# Patient Record
Sex: Female | Born: 1945 | Race: Black or African American | Hispanic: No | Marital: Married | Smoking: Never smoker
Health system: Southern US, Community
[De-identification: ages and names within clinical notes are randomized; demographics above are authoritative.]

## PROBLEM LIST (undated history)

## (undated) DIAGNOSIS — B029 Zoster without complications: Secondary | ICD-10-CM

## (undated) DIAGNOSIS — E7800 Pure hypercholesterolemia, unspecified: Secondary | ICD-10-CM

## (undated) DIAGNOSIS — E119 Type 2 diabetes mellitus without complications: Secondary | ICD-10-CM

## (undated) DIAGNOSIS — I10 Essential (primary) hypertension: Secondary | ICD-10-CM

---

## 1976-09-09 HISTORY — PX: GUM SURGERY: SHX658

## 2009-12-26 ENCOUNTER — Ambulatory Visit: Payer: Self-pay | Admitting: Cardiology

## 2009-12-26 ENCOUNTER — Emergency Department (HOSPITAL_COMMUNITY): Admission: EM | Admit: 2009-12-26 | Discharge: 2009-12-27 | Payer: Self-pay | Admitting: Emergency Medicine

## 2009-12-27 ENCOUNTER — Encounter (INDEPENDENT_AMBULATORY_CARE_PROVIDER_SITE_OTHER): Payer: Self-pay | Admitting: Emergency Medicine

## 2009-12-27 ENCOUNTER — Ambulatory Visit: Payer: Self-pay | Admitting: Vascular Surgery

## 2010-01-18 ENCOUNTER — Emergency Department (HOSPITAL_COMMUNITY): Admission: EM | Admit: 2010-01-18 | Discharge: 2010-01-18 | Payer: Self-pay | Admitting: Emergency Medicine

## 2010-11-27 LAB — CBC
Hemoglobin: 13.6 g/dL (ref 12.0–15.0)
MCV: 87.9 fL (ref 78.0–100.0)
Platelets: 265 10*3/uL (ref 150–400)
RDW: 13.9 % (ref 11.5–15.5)

## 2010-11-27 LAB — CK TOTAL AND CKMB (NOT AT ARMC)
CK, MB: 1.5 ng/mL (ref 0.3–4.0)
Total CK: 97 U/L (ref 7–177)

## 2010-11-27 LAB — URINE MICROSCOPIC-ADD ON

## 2010-11-27 LAB — LIPID PANEL
Cholesterol: 239 mg/dL — ABNORMAL HIGH (ref 0–200)
HDL: 55 mg/dL (ref >39)
Total CHOL/HDL Ratio: 4.3 RATIO
Triglycerides: 97 mg/dL (ref <150)
VLDL: 19 mg/dL (ref 0–40)

## 2010-11-27 LAB — URINALYSIS, ROUTINE W REFLEX MICROSCOPIC
Bilirubin Urine: NEGATIVE
Hgb urine dipstick: NEGATIVE
Nitrite: NEGATIVE
Specific Gravity, Urine: 1.021 (ref 1.005–1.030)
Urobilinogen, UA: 0.2 mg/dL (ref 0.0–1.0)

## 2010-11-27 LAB — DIFFERENTIAL
Basophils Absolute: 0 10*3/uL (ref 0.0–0.1)
Eosinophils Relative: 2 % (ref 0–5)
Lymphocytes Relative: 26 % (ref 12–46)
Monocytes Relative: 8 % (ref 3–12)
Neutro Abs: 4.9 10*3/uL (ref 1.7–7.7)

## 2010-11-27 LAB — POCT CARDIAC MARKERS
CKMB, poc: 2 ng/mL (ref 1.0–8.0)
Myoglobin, poc: 52.7 ng/mL (ref 12–200)
Troponin i, poc: <0.05 ng/mL (ref 0.00–0.09)

## 2010-11-27 LAB — HEPATIC FUNCTION PANEL
AST: 20 U/L (ref 0–37)
Bilirubin, Direct: <0.1 mg/dL (ref 0.0–0.3)

## 2010-11-27 LAB — BASIC METABOLIC PANEL
BUN: 19 mg/dL (ref 6–23)
CO2: 29 mEq/L (ref 19–32)
Calcium: 9.5 mg/dL (ref 8.4–10.5)
Chloride: 103 mEq/L (ref 96–112)
GFR calc Af Amer: >60 mL/min (ref >60)
Glucose, Bld: 128 mg/dL — ABNORMAL HIGH (ref 70–99)
Sodium: 139 mEq/L (ref 135–145)

## 2010-11-27 LAB — PROTIME-INR: Prothrombin Time: 13.8 seconds (ref 11.6–15.2)

## 2010-11-27 LAB — URINE CULTURE

## 2012-08-16 ENCOUNTER — Encounter (HOSPITAL_COMMUNITY): Payer: Self-pay | Admitting: Emergency Medicine

## 2012-08-16 ENCOUNTER — Emergency Department (HOSPITAL_COMMUNITY)
Admission: EM | Admit: 2012-08-16 | Discharge: 2012-08-16 | Disposition: A | Discharge status: Home / Self Care | Payer: Self-pay | Source: Home / Self Care | Attending: Family Medicine | Admitting: Family Medicine

## 2012-08-16 DIAGNOSIS — B029 Zoster without complications: Secondary | ICD-10-CM

## 2012-08-16 HISTORY — DX: Essential (primary) hypertension: I10

## 2012-08-16 MED ORDER — VALACYCLOVIR HCL 1 G PO TABS: 1000.0000 mg | ORAL_TABLET | Freq: Three times a day (TID) | ORAL | Status: DC

## 2012-08-16 NOTE — ED Provider Notes (Signed)
History     CSN: [id number]  Arrival date & time 08/16/12  1144   First MD Initiated Contact with Patient 08/16/12 1152      Chief Complaint  Patient presents with  . Rash    rash on back and some spots on chest  . Shoulder Pain    left shoulder blade pain.denies injury    (Consider location/radiation/quality/duration/timing/severity/associated sxs/prior treatment) Patient is a 66 y.o. female presenting with rash and shoulder pain. The history is provided by the patient.  Rash  This is a new problem. The current episode started more than 2 days ago. The problem has been gradually worsening. The problem is associated with an unknown factor. There has been no fever. The rash is present on the trunk. The pain is moderate. Associated symptoms include blisters and pain.  Shoulder Pain    Past Medical History  Diagnosis Date  . Hypertension     History reviewed. No pertinent past surgical history.  History reviewed. No pertinent family history.  History  Substance Use Topics  . Smoking status: Never Smoker   . Smokeless tobacco: Not on file  . Alcohol Use: No    OB History    Grav Para Term Preterm Abortions TAB SAB Ect Mult Living                  Review of Systems  Constitutional: Negative.   Musculoskeletal: Positive for back pain.  Skin: Positive for rash.    Allergies  Review of patient's allergies indicates no known allergies.  Home Medications   Current Outpatient Rx  Name  Route  Sig  Dispense  Refill  . HYDROCHLOROTHIAZIDE 25 MG PO TABS   Oral   Take 25 mg by mouth daily.         . TRIAMTERENE-HCTZ 37.5-25 MG PO CAPS   Oral   Take 1 capsule by mouth every morning.         Marland Kitchen VALACYCLOVIR HCL 1 G PO TABS   Oral   Take 1 tablet (1,000 mg total) by mouth 3 (three) times daily.   21 tablet   0     BP 186/102  Pulse 74  Temp 98.4 F (36.9 C) (Oral)  Resp 17  SpO2 98%  Physical Exam  Nursing note and vitals reviewed. Constitutional:  She appears well-developed and well-nourished.  Neck: Normal range of motion. Neck supple.  Pulmonary/Chest: Effort normal and breath sounds normal. She exhibits tenderness.  Lymphadenopathy:    She has no cervical adenopathy.  Skin: Skin is warm and dry. Rash noted.       Erythematous based grouped crusting vesicles upper left back extending to upper ant chest.    ED Course  Procedures (including critical care time)  Labs Reviewed - No data to display No results found.   1. Shingles       MDM          Linna Hoff, MD 08/16/12 1218

## 2012-08-16 NOTE — ED Notes (Signed)
Waiting discharge  

## 2012-08-16 NOTE — ED Notes (Signed)
Pt states that she has a rash on her back and some spots on chest. Pt was seen at a urgent care on Wednesday and given an injection that relieved pain and rash .  Pt states that felt better for a couple of days then symptoms returned and is gradually getting worse. Rash is red and blistered

## 2012-08-17 ENCOUNTER — Encounter (HOSPITAL_COMMUNITY): Payer: Self-pay | Admitting: *Deleted

## 2012-08-17 ENCOUNTER — Emergency Department (HOSPITAL_COMMUNITY)
Admission: EM | Admit: 2012-08-17 | Discharge: 2012-08-18 | Disposition: A | Discharge status: Home / Self Care | Payer: Self-pay | Attending: Emergency Medicine | Admitting: Emergency Medicine

## 2012-08-17 DIAGNOSIS — Z8673 Personal history of transient ischemic attack (TIA), and cerebral infarction without residual deficits: Secondary | ICD-10-CM | POA: Insufficient documentation

## 2012-08-17 DIAGNOSIS — Z79899 Other long term (current) drug therapy: Secondary | ICD-10-CM | POA: Insufficient documentation

## 2012-08-17 DIAGNOSIS — I10 Essential (primary) hypertension: Secondary | ICD-10-CM | POA: Insufficient documentation

## 2012-08-17 DIAGNOSIS — R42 Dizziness and giddiness: Secondary | ICD-10-CM | POA: Insufficient documentation

## 2012-08-17 DIAGNOSIS — Z7982 Long term (current) use of aspirin: Secondary | ICD-10-CM | POA: Insufficient documentation

## 2012-08-17 LAB — COMPREHENSIVE METABOLIC PANEL
Albumin: 4 g/dL (ref 3.5–5.2)
BUN: 24 mg/dL — ABNORMAL HIGH (ref 6–23)
CO2: 23 mEq/L (ref 19–32)
Calcium: 9.8 mg/dL (ref 8.4–10.5)
Chloride: 96 mEq/L (ref 96–112)
GFR calc non Af Amer: 87 mL/min — ABNORMAL LOW (ref >90)
Potassium: 4.2 mEq/L (ref 3.5–5.1)
Sodium: 133 mEq/L — ABNORMAL LOW (ref 135–145)

## 2012-08-17 LAB — CBC WITH DIFFERENTIAL/PLATELET
Basophils Absolute: 0 10*3/uL (ref 0.0–0.1)
HCT: 42.6 % (ref 36.0–46.0)
MCH: 28.8 pg (ref 26.0–34.0)
MCHC: 34 g/dL (ref 30.0–36.0)
MCV: 84.5 fL (ref 78.0–100.0)
Monocytes Absolute: 0.5 10*3/uL (ref 0.1–1.0)
Neutrophils Relative %: 60 % (ref 43–77)
Platelets: 319 10*3/uL (ref 150–400)
RBC: 5.04 MIL/uL (ref 3.87–5.11)
RDW: 13.6 % (ref 11.5–15.5)

## 2012-08-17 NOTE — ED Notes (Signed)
CBG 134 

## 2012-08-17 NOTE — ED Notes (Signed)
The pt had a dizzy episode 45 minutes ago that lasted for a short time.  No dizziness now..  She has shingles on her posterior chest that was diagnosed yesterday. No anterior chest pain

## 2012-08-17 NOTE — ED Notes (Signed)
Pt reports taking her valtrex at 7 pm and tylenol at 9 pm.

## 2012-08-17 NOTE — ED Notes (Signed)
Pt ambulated to room without difficulty. Pt reported no dizziness on the way to room

## 2012-08-18 LAB — GLUCOSE, CAPILLARY: Glucose-Capillary: 134 mg/dL — ABNORMAL HIGH (ref 70–99)

## 2012-08-18 NOTE — ED Provider Notes (Addendum)
History     CSN: [id number]  Arrival date & time 08/17/12  2231   First MD Initiated Contact with Patient 08/18/12 0059      Chief Complaint  Patient presents with  . Dizziness    (Consider location/radiation/quality/duration/timing/severity/associated sxs/prior treatment) HPI Comments: Patient states, that when she stood up from a lying position.  She had about 7 minutes of dizziness.  That resolved after she sat back down.  She reports that in April of 2001 she had a TIA.  She's had no problems since then.  She also reports, that she recently started taking Dyazide for high blood pressure, as well as one day ago.  Started taking Valtrex for a new outbreak of shingles.  She denies any chest pain, shortness of breath, nausea, headache, or visual change  The history is provided by the patient.    Past Medical History  Diagnosis Date  . Hypertension     History reviewed. No pertinent past surgical history.  No family history on file.  History  Substance Use Topics  . Smoking status: Never Smoker   . Smokeless tobacco: Not on file  . Alcohol Use: No    OB History    Grav Para Term Preterm Abortions TAB SAB Ect Mult Living                  Review of Systems  Constitutional: Negative for fever.  Respiratory: Negative for shortness of breath.   Cardiovascular: Negative for chest pain.  Gastrointestinal: Negative for nausea.  Skin: Positive for rash. Negative for wound.  Neurological: Positive for dizziness. Negative for weakness and headaches.    Allergies  Review of patient's allergies indicates no known allergies.  Home Medications   Current Outpatient Rx  Name  Route  Sig  Dispense  Refill  . ACETAMINOPHEN 325 MG PO TABS   Oral   Take 650 mg by mouth every 6 (six) hours as needed. For pain         . ASPIRIN 81 MG PO CHEW   Oral   Chew 81 mg by mouth daily.         . TRIAMTERENE-HCTZ 37.5-25 MG PO CAPS   Oral   Take 1 capsule by mouth every  morning.         Marland Kitchen VALACYCLOVIR HCL 1 G PO TABS   Oral   Take 1 tablet (1,000 mg total) by mouth 3 (three) times daily.   21 tablet   0     BP 153/94  Pulse 89  Temp 98 F (36.7 C) (Oral)  Resp 20  SpO2 98%  Physical Exam  Constitutional: She is oriented to person, place, and time. She appears well-developed and well-nourished.  HENT:  Head: Normocephalic.  Left Ear: External ear normal.  Eyes: Pupils are equal, round, and reactive to light.  Cardiovascular: Normal rate.   Pulmonary/Chest: Effort normal.  Abdominal: She exhibits no distension. There is no tenderness.  Musculoskeletal: She exhibits no edema and no tenderness.  Neurological: She is alert and oriented to person, place, and time.  Skin: Rash noted.       Shingles rash back to front along thoracic nerve pathway     ED Course  Procedures (including critical care time)  Labs Reviewed  COMPREHENSIVE METABOLIC PANEL - Abnormal; Notable for the following:    Sodium 133 (*)     Glucose, Bld 203 (*)     BUN 24 (*)     Total Protein 8.8 (*)  Total Bilirubin 0.2 (*)     GFR calc non Af Amer 87 (*)     All other components within normal limits  CBC WITH DIFFERENTIAL  TROPONIN I  TROPONIN I   No results found.   1. Dizzy     ED ECG REPORT   Date: 08/18/2012  EKG Time: 2:39 AM  Rate: 93  Rhythm: normal sinus rhythm,  there are no previous tracings available for comparison  Axis: normal  Intervals:none  ST&T Change: biatrial enlargement  Narrative Interpretation: abnormal            MDM   Patient had no further episodes of "dizziness" I feel that this was a situational dizziness.  When she got up from lying down and starting a new blood pressure medication, and instructed her to change positions slowly        Arman Filter, NP 08/18/12 0239  Arman Filter, NP 08/26/12 [phone]

## 2012-08-18 NOTE — ED Provider Notes (Signed)
Medical screening examination/treatment/procedure(s) were performed by non-physician practitioner and as supervising physician I was immediately available for consultation/collaboration.   Azarie Coriz L Baylyn Sickles, MD 08/18/12 0734 

## 2012-12-19 ENCOUNTER — Emergency Department (HOSPITAL_BASED_OUTPATIENT_CLINIC_OR_DEPARTMENT_OTHER): Payer: Medicare Other

## 2012-12-19 ENCOUNTER — Encounter (HOSPITAL_BASED_OUTPATIENT_CLINIC_OR_DEPARTMENT_OTHER): Payer: Self-pay

## 2012-12-19 ENCOUNTER — Emergency Department (HOSPITAL_BASED_OUTPATIENT_CLINIC_OR_DEPARTMENT_OTHER)
Admission: EM | Admit: 2012-12-19 | Discharge: 2012-12-19 | Disposition: A | Discharge status: Home / Self Care | Payer: Medicare Other | Attending: Emergency Medicine | Admitting: Emergency Medicine

## 2012-12-19 DIAGNOSIS — Y92838 Other recreation area as the place of occurrence of the external cause: Secondary | ICD-10-CM | POA: Insufficient documentation

## 2012-12-19 DIAGNOSIS — I10 Essential (primary) hypertension: Secondary | ICD-10-CM | POA: Insufficient documentation

## 2012-12-19 DIAGNOSIS — Y9239 Other specified sports and athletic area as the place of occurrence of the external cause: Secondary | ICD-10-CM | POA: Insufficient documentation

## 2012-12-19 DIAGNOSIS — Y9301 Activity, walking, marching and hiking: Secondary | ICD-10-CM | POA: Insufficient documentation

## 2012-12-19 DIAGNOSIS — Z7982 Long term (current) use of aspirin: Secondary | ICD-10-CM | POA: Insufficient documentation

## 2012-12-19 DIAGNOSIS — S0083XA Contusion of other part of head, initial encounter: Secondary | ICD-10-CM

## 2012-12-19 DIAGNOSIS — Z23 Encounter for immunization: Secondary | ICD-10-CM | POA: Insufficient documentation

## 2012-12-19 DIAGNOSIS — S0003XA Contusion of scalp, initial encounter: Secondary | ICD-10-CM | POA: Insufficient documentation

## 2012-12-19 DIAGNOSIS — W1809XA Striking against other object with subsequent fall, initial encounter: Secondary | ICD-10-CM | POA: Insufficient documentation

## 2012-12-19 DIAGNOSIS — W010XXA Fall on same level from slipping, tripping and stumbling without subsequent striking against object, initial encounter: Secondary | ICD-10-CM | POA: Insufficient documentation

## 2012-12-19 MED ORDER — TETANUS-DIPHTH-ACELL PERTUSSIS 5-2.5-18.5 LF-MCG/0.5 IM SUSP
0.5000 mL | Freq: Once | INTRAMUSCULAR | Status: AC
  Administered 2012-12-19: 0.5 mL via INTRAMUSCULAR
  Filled 2012-12-19: qty 0.5

## 2012-12-19 NOTE — ED Provider Notes (Signed)
History     CSN: [id number]  Arrival date & time 12/19/12  4098   First MD Initiated Contact with Patient 12/19/12 2152      Chief Complaint  Patient presents with  . fall-laceration to forehead     (Consider location/radiation/quality/duration/timing/severity/associated sxs/prior treatment) Patient is a 67 y.o. female presenting with fall. The history is provided by the patient. No language interpreter was used.  Fall The accident occurred 6 to 12 hours ago. The fall occurred while walking. She fell from a height of 3 to 5 ft. Impact surface: exercise block. There was no blood loss. The point of impact was the head. The pain is present in the head. The pain is moderate. She was ambulatory at the scene. Pertinent negatives include no visual change. She has tried nothing for the symptoms.   Pt reports she tripped and fell and hit head.   Pt complains of large area of swelling over right forehead Past Medical History  Diagnosis Date  . Hypertension     History reviewed. No pertinent past surgical history.  No family history on file.  History  Substance Use Topics  . Smoking status: Never Smoker   . Smokeless tobacco: Not on file  . Alcohol Use: No    OB History   Grav Para Term Preterm Abortions TAB SAB Ect Mult Living                  Review of Systems  HENT: Positive for facial swelling.   All other systems reviewed and are negative.    Allergies  Review of patient's allergies indicates no known allergies.  Home Medications   Current Outpatient Rx  Name  Route  Sig  Dispense  Refill  . acetaminophen (TYLENOL) 325 MG tablet   Oral   Take 650 mg by mouth every 6 (six) hours as needed. For pain         . aspirin 81 MG chewable tablet   Oral   Chew 81 mg by mouth daily.           BP 190/78  Pulse 79  Temp(Src) 98.4 F (36.9 C) (Oral)  Resp 18  Ht 5\' 2"  (1.575 m)  Wt 170 lb (77.111 kg)  BMI 31.09 kg/m2  SpO2 100%  Physical Exam  Nursing note  and vitals reviewed. Constitutional: She is oriented to person, place, and time. She appears well-developed and well-nourished.  HENT:  Right Ear: External ear normal.  Left Ear: External ear normal.  Nose: Nose normal.  Mouth/Throat: Oropharynx is clear and moist.  Eyes: Conjunctivae and EOM are normal. Pupils are equal, round, and reactive to light.  Neck: Normal range of motion.  Cardiovascular: Normal rate and normal heart sounds.   Pulmonary/Chest: Effort normal and breath sounds normal.  Abdominal: Soft.  Musculoskeletal:  Left elbow, nontender,  Right knee nontender  Neurological: She is alert and oriented to person, place, and time. She has normal reflexes.  Swollen right forehead, bruised,    Skin: Skin is warm.  Psychiatric: She has a normal mood and affect.    ED Course  Procedures (including critical care time)  Labs Reviewed - No data to display Ct Head Wo Contrast  12/19/2012  *RADIOLOGY REPORT*  Clinical Data: 67 year old female status post fall.  Swelling. Hypertension.  CT HEAD WITHOUT CONTRAST  Technique:  Contiguous axial images were obtained from the base of the skull through the vertex without contrast.  Comparison: Brain MRI and head CT 12/27/2009.  Findings: Visualized paranasal sinuses and mastoids are clear. Right forehead scalp hematoma measures up to 7 mm in thickness. Underlying right frontal bone intact. Visualized orbit soft tissues are within normal limits.  Osteopenia. No acute osseous abnormality identified.  No other scalp soft tissue injury.  Calcified atherosclerosis at the skull base.  Stable and normal cerebral volume.  No ventriculomegaly. No midline shift, mass effect, or evidence of mass lesion.  No acute intracranial hemorrhage identified.  Chronic left basal ganglia lacunar infarcts with subsequent hypodensity, not significantly changed.  Stable and normal for age gray-white matter differentiation elsewhere. No suspicious intracranial vascular  hyperdensity.  IMPRESSION: 1.  Scalp soft tissue injury without underlying fracture. 2.  Stable chronic small vessel disease. No acute intracranial abnormality.   Original Report Authenticated By: Erskine Speed, M.D.      No diagnosis found.    MDM  I advised ice and ibuprofen        Lonia Skinner Waka, PA-C 12/19/12 2300

## 2012-12-19 NOTE — ED Notes (Signed)
Patient reports that she fell over stool at gym at 1pm today and has abrasions to right side of forehead, minimal bleeding on arrival, no loc.

## 2012-12-20 NOTE — ED Provider Notes (Signed)
Medical screening examination/treatment/procedure(s) were performed by non-physician practitioner and as supervising physician I was immediately available for consultation/collaboration.   Ajene Carchi L Leilanni Halvorson, MD 12/20/12 0657 

## 2012-12-22 ENCOUNTER — Encounter (HOSPITAL_BASED_OUTPATIENT_CLINIC_OR_DEPARTMENT_OTHER): Payer: Self-pay | Admitting: Emergency Medicine

## 2012-12-22 ENCOUNTER — Emergency Department (HOSPITAL_BASED_OUTPATIENT_CLINIC_OR_DEPARTMENT_OTHER): Payer: Medicare Other

## 2012-12-22 ENCOUNTER — Emergency Department (HOSPITAL_BASED_OUTPATIENT_CLINIC_OR_DEPARTMENT_OTHER)
Admission: EM | Admit: 2012-12-22 | Discharge: 2012-12-22 | Disposition: A | Discharge status: Home / Self Care | Payer: Medicare Other | Attending: Emergency Medicine | Admitting: Emergency Medicine

## 2012-12-22 DIAGNOSIS — S1093XA Contusion of unspecified part of neck, initial encounter: Secondary | ICD-10-CM | POA: Insufficient documentation

## 2012-12-22 DIAGNOSIS — Z7982 Long term (current) use of aspirin: Secondary | ICD-10-CM | POA: Insufficient documentation

## 2012-12-22 DIAGNOSIS — Y939 Activity, unspecified: Secondary | ICD-10-CM | POA: Insufficient documentation

## 2012-12-22 DIAGNOSIS — W1809XA Striking against other object with subsequent fall, initial encounter: Secondary | ICD-10-CM | POA: Insufficient documentation

## 2012-12-22 DIAGNOSIS — Y929 Unspecified place or not applicable: Secondary | ICD-10-CM | POA: Insufficient documentation

## 2012-12-22 DIAGNOSIS — I10 Essential (primary) hypertension: Secondary | ICD-10-CM | POA: Insufficient documentation

## 2012-12-22 DIAGNOSIS — R42 Dizziness and giddiness: Secondary | ICD-10-CM | POA: Insufficient documentation

## 2012-12-22 DIAGNOSIS — H5789 Other specified disorders of eye and adnexa: Secondary | ICD-10-CM | POA: Insufficient documentation

## 2012-12-22 DIAGNOSIS — S0990XA Unspecified injury of head, initial encounter: Secondary | ICD-10-CM

## 2012-12-22 DIAGNOSIS — S0083XD Contusion of other part of head, subsequent encounter: Secondary | ICD-10-CM

## 2012-12-22 DIAGNOSIS — S0003XA Contusion of scalp, initial encounter: Secondary | ICD-10-CM | POA: Insufficient documentation

## 2012-12-22 NOTE — ED Notes (Signed)
Patient transported to CT 

## 2012-12-22 NOTE — ED Notes (Signed)
Pt fell on Saturday, hit her head.  No loc.  Pt woke up this am with headache and dizziness.  No N/V or confusion.

## 2012-12-22 NOTE — ED Provider Notes (Signed)
History     CSN: [id number]  Arrival date & time 12/22/12  1104   First MD Initiated Contact with Patient 12/22/12 1132      Chief Complaint  Patient presents with  . Headache  . Facial Swelling    (Consider location/radiation/quality/duration/timing/severity/associated sxs/prior treatment) HPI Comments: Patient present with facial swelling, headache the onset yesterday. She was seen on April 12 after a fall where she hit her head and scraped her forehead. CT scan was negative. She states she felt well for one day but then headache and dizziness returned. She noticed swelling and bruising below her eyes bilaterally. Denies any visual changes or pain with eye movement. Denies any nausea, vomiting, confusion or fever. Denies taking any anticoagulants except aspirin. Denies any new falls.  The history is provided by the patient.    Past Medical History  Diagnosis Date  . Hypertension     No past surgical history on file.  No family history on file.  History  Substance Use Topics  . Smoking status: Never Smoker   . Smokeless tobacco: Not on file  . Alcohol Use: No    OB History   Grav Para Term Preterm Abortions TAB SAB Ect Mult Living                  Review of Systems  Constitutional: Negative for activity change and appetite change.  HENT: Negative for neck pain.   Eyes: Positive for redness. Negative for visual disturbance.  Respiratory: Negative for chest tightness.   Gastrointestinal: Negative for nausea, vomiting and abdominal pain.  Neurological: Positive for dizziness and headaches. Negative for weakness.  A complete 10 system review of systems was obtained and all systems are negative except as noted in the HPI and PMH.    Allergies  Review of patient's allergies indicates no known allergies.  Home Medications   Current Outpatient Rx  Name  Route  Sig  Dispense  Refill  . naproxen sodium (ANAPROX) 220 MG tablet   Oral   Take 220 mg by mouth 2 (two)  times daily with a meal.         . acetaminophen (TYLENOL) 325 MG tablet   Oral   Take 650 mg by mouth every 6 (six) hours as needed. For pain         . aspirin 81 MG chewable tablet   Oral   Chew 81 mg by mouth daily.           BP 185/91  Pulse 68  Temp(Src) 98.9 F (37.2 C) (Oral)  Resp 20  Ht 5\' 2"  (1.575 m)  Wt 165 lb (74.844 kg)  BMI 30.17 kg/m2  SpO2 100%  Physical Exam  Constitutional: She is oriented to person, place, and time. She appears well-developed and well-nourished. No distress.  HENT:  Head: Normocephalic.  Mouth/Throat: Oropharynx is clear and moist. No oropharyngeal exudate.  Right forehead hematoma and abrasion  Eyes: Conjunctivae and EOM are normal. Pupils are equal, round, and reactive to light.  Periorbital ecchymosis of lower lids bilaterally, raccoon eyes  Neck: Normal range of motion. Neck supple.  No C-spine pain, step-off or deformity  Cardiovascular: Normal rate, regular rhythm and normal heart sounds.   No murmur heard. Pulmonary/Chest: Effort normal and breath sounds normal. No respiratory distress.  Abdominal: Soft. There is no tenderness. There is no rebound and no guarding.  Musculoskeletal: Normal range of motion. She exhibits no edema and no tenderness.  Neurological: She is alert and  oriented to person, place, and time. No cranial nerve deficit. She exhibits normal muscle tone. Coordination normal.  CN 2-12 intact, 5/5 strength throughout, no ataxia on finger to nose  Skin: Skin is warm.    ED Course  Procedures (including critical care time)  Labs Reviewed - No data to display Ct Head Wo Contrast  12/22/2012  *RADIOLOGY REPORT*  Clinical Data:  Fall 12/19/2012 with head injury.  Progressive headache and dizziness  CT HEAD WITHOUT CONTRAST CT MAXILLOFACIAL WITHOUT CONTRAST  Technique:  Multidetector CT imaging of the head and maxillofacial structures were performed using the standard protocol without intravenous contrast.  Multiplanar CT image reconstructions of the maxillofacial structures were also generated.  Comparison:  CT head 12/19/2012  CT HEAD  Findings: Small right frontal scalp hematoma.  Negative for skull fracture.  Negative for intracranial hemorrhage.  Negative for mass or infarct.  Ventricle size is normal.  IMPRESSION: No acute intracranial abnormality.  CT MAXILLOFACIAL  Findings:   Negative for facial fracture.  No fracture of the mandible or orbit.  Zygomatic arch is intact bilaterally.  Nasal bone is intact.  Extensive dental disease is present with periapical lucency lucencies around multiple teeth.  IMPRESSION: Negative for fracture.   Original Report Authenticated By: Janeece Riggers, M.D.    Ct Maxillofacial Wo Cm  12/22/2012  *RADIOLOGY REPORT*  Clinical Data:  Fall 12/19/2012 with head injury.  Progressive headache and dizziness  CT HEAD WITHOUT CONTRAST CT MAXILLOFACIAL WITHOUT CONTRAST  Technique:  Multidetector CT imaging of the head and maxillofacial structures were performed using the standard protocol without intravenous contrast. Multiplanar CT image reconstructions of the maxillofacial structures were also generated.  Comparison:  CT head 12/19/2012  CT HEAD  Findings: Small right frontal scalp hematoma.  Negative for skull fracture.  Negative for intracranial hemorrhage.  Negative for mass or infarct.  Ventricle size is normal.  IMPRESSION: No acute intracranial abnormality.  CT MAXILLOFACIAL  Findings:   Negative for facial fracture.  No fracture of the mandible or orbit.  Zygomatic arch is intact bilaterally.  Nasal bone is intact.  Extensive dental disease is present with periapical lucency lucencies around multiple teeth.  IMPRESSION: Negative for fracture.   Original Report Authenticated By: Janeece Riggers, M.D.      1. Facial contusion, subsequent encounter   2. Head injury, initial encounter       MDM  Facial swelling and bruising after fall 3 days ago. Nonfocal neuro exam.  CT from  3 days ago did not show any acute pathology. Patient with extensive ecchymosis below eyes today that concerns her.  CT negative for any facial bone fractures.  Patient instructed on time course of concussion and head injury. She'll return with worsening headache, confusion, vomiting, vision changes or any other concerns.    Glynn Octave, MD 12/22/12 [phone]

## 2013-02-11 ENCOUNTER — Encounter (HOSPITAL_COMMUNITY): Payer: Self-pay | Admitting: Emergency Medicine

## 2013-02-11 ENCOUNTER — Emergency Department (INDEPENDENT_AMBULATORY_CARE_PROVIDER_SITE_OTHER)
Admission: EM | Admit: 2013-02-11 | Discharge: 2013-02-11 | Disposition: A | Discharge status: Home / Self Care | Payer: Medicare Other | Source: Home / Self Care | Attending: Emergency Medicine | Admitting: Emergency Medicine

## 2013-02-11 ENCOUNTER — Emergency Department (INDEPENDENT_AMBULATORY_CARE_PROVIDER_SITE_OTHER): Payer: Medicare Other

## 2013-02-11 DIAGNOSIS — M546 Pain in thoracic spine: Secondary | ICD-10-CM

## 2013-02-11 MED ORDER — CYCLOBENZAPRINE HCL 10 MG PO TABS: 10.0000 mg | ORAL_TABLET | Freq: Two times a day (BID) | ORAL | Status: DC | PRN

## 2013-02-11 MED ORDER — MELOXICAM 7.5 MG PO TABS: 7.5000 mg | ORAL_TABLET | Freq: Every day | ORAL | Status: AC

## 2013-02-11 MED ORDER — MELOXICAM 7.5 MG PO TABS: 7.5000 mg | ORAL_TABLET | Freq: Every day | ORAL | Status: DC

## 2013-02-11 NOTE — ED Notes (Signed)
Patient reported she needed to leave to get child.  Stated she could return, but would have to leave.

## 2013-02-11 NOTE — ED Provider Notes (Signed)
History     CSN: [id number]  Arrival date & time 02/11/13  1214   First MD Initiated Contact with Patient 02/11/13 1332      Chief Complaint  Patient presents with  . Back Pain    (Consider location/radiation/quality/duration/timing/severity/associated sxs/prior treatment) HPI Comments: Patient presents to urgent care describing pain on the left side of her back that started Tuesday when she was working. She was just in a sitting position when this discomfort started. She describes the pain somewhat exacerbates when she takes a deep breath, most movements in palpating the area doesn't seem to exacerbate the pain. Patient denies any other symptoms such as radiation of this pain, associated chest pain, shortness of breath or cough. She denies any recent injuries or falls.  Patient is a 67 y.o. female presenting with back pain.  Back Pain Location:  Thoracic spine Quality:  Aching Radiates to:  Does not radiate Pain severity:  Moderate Onset quality:  Sudden Timing:  Constant Chronicity:  New Context: not emotional stress, not physical stress and not recent illness   Relieved by:  Nothing Worsened by:  Movement, deep breathing and coughing Associated symptoms: no abdominal pain, no abdominal swelling, no bladder incontinence, no bowel incontinence, no dysuria, no fever, no headaches, no numbness, no paresthesias, no perianal numbness, no tingling, no weakness and no weight loss     Past Medical History  Diagnosis Date  . Hypertension     History reviewed. No pertinent past surgical history.  History reviewed. No pertinent family history.  History  Substance Use Topics  . Smoking status: Never Smoker   . Smokeless tobacco: Not on file  . Alcohol Use: No    OB History   Grav Para Term Preterm Abortions TAB SAB Ect Mult Living                  Review of Systems  Constitutional: Positive for activity change. Negative for fever, chills, weight loss, diaphoresis, appetite  change and fatigue.  Respiratory: Negative for apnea, choking, chest tightness and shortness of breath.   Gastrointestinal: Negative for abdominal pain and bowel incontinence.  Genitourinary: Negative for bladder incontinence and dysuria.  Musculoskeletal: Positive for back pain. Negative for myalgias, joint swelling and arthralgias.  Skin: Negative for color change, rash and wound.  Neurological: Negative for tingling, weakness, numbness, headaches and paresthesias.    Allergies  Review of patient's allergies indicates no known allergies.  Home Medications   Current Outpatient Rx  Name  Route  Sig  Dispense  Refill  . acetaminophen (TYLENOL) 325 MG tablet   Oral   Take 650 mg by mouth every 6 (six) hours as needed. For pain         . aspirin 81 MG chewable tablet   Oral   Chew 81 mg by mouth daily.         . cyclobenzaprine (FLEXERIL) 10 MG tablet   Oral   Take 1 tablet (10 mg total) by mouth 2 (two) times daily as needed for muscle spasms.   20 tablet   0   . meloxicam (MOBIC) 7.5 MG tablet   Oral   Take 1 tablet (7.5 mg total) by mouth daily. Take one tablet daily for 2 weeks   14 tablet   0     BP 159/75  Pulse 70  Temp(Src) 98 F (36.7 C) (Oral)  Resp 16  SpO2 100%  Physical Exam  Nursing note and vitals reviewed. Constitutional: She appears well-developed  and well-nourished.  Pulmonary/Chest: Effort normal and breath sounds normal. No respiratory distress. She has no wheezes. She has no rales. She exhibits no tenderness.  Abdominal: Soft.  Musculoskeletal: She exhibits tenderness. She exhibits no edema.       Back:  Neurological: She is alert.  Skin: No rash noted. No erythema.    ED Course  Procedures (including critical care time)  Labs Reviewed - No data to display Dg Chest 2 View  02/11/2013   *RADIOLOGY REPORT*  Clinical Data: 68 year old female with back pain.  CHEST - 2 VIEW  Comparison: 12/27/2009.  Findings: Chronic cardiomegaly is  stable.  Mild tortuosity of the thoracic aorta. Other mediastinal contours are within normal limits.  Visualized tracheal air column is within normal limits. Stable widespread increased pulmonary interstitial markings.  No pneumothorax, pulmonary edema, pleural effusion or confluent pulmonary opacity. No acute osseous abnormality identified.  IMPRESSION: Stable mild cardiomegaly.  No acute cardiopulmonary abnormality.   Original Report Authenticated By: Erskine Speed, M.D.     1. Thoracic back pain       MDM  Patient left urgent care prior to discharge. Have called Mrs. Howson, informing her of her x-ray results and my diagnostic impression we discussed the 2 prescriptions that I want her to try and we also discussed circumstances that will require further evaluation and followup. She agrees with treatment plan and followup care as necessary. Have discussed with her that her discharge papers and prescriptions will be in the reception area-        Jimmie Molly, MD 02/11/13 1439

## 2013-02-11 NOTE — ED Notes (Signed)
Patient has pain on left side of back, along bra line.  Patient reports this sensation reminds here of when she had shingles in December.  Onset of this episode Tuesday 02/09/13.  Patient noticed pain throughout the day, worsening.  No known injury

## 2013-02-11 NOTE — ED Notes (Signed)
Instructed to place on gown 

## 2014-03-27 ENCOUNTER — Encounter (HOSPITAL_COMMUNITY): Payer: Self-pay | Admitting: Emergency Medicine

## 2014-03-27 ENCOUNTER — Emergency Department (INDEPENDENT_AMBULATORY_CARE_PROVIDER_SITE_OTHER)
Admission: EM | Admit: 2014-03-27 | Discharge: 2014-03-27 | Disposition: A | Discharge status: Home / Self Care | Payer: Medicare Other | Source: Home / Self Care | Attending: Emergency Medicine | Admitting: Emergency Medicine

## 2014-03-27 DIAGNOSIS — R05 Cough: Secondary | ICD-10-CM

## 2014-03-27 DIAGNOSIS — R059 Cough, unspecified: Secondary | ICD-10-CM

## 2014-03-27 HISTORY — DX: Pure hypercholesterolemia, unspecified: E78.00

## 2014-03-27 HISTORY — DX: Type 2 diabetes mellitus without complications: E11.9

## 2014-03-27 NOTE — ED Provider Notes (Signed)
CSN: [id number]     [id number] date & time 03/27/14  1731 History   First MD Initiated Contact with Patient 03/27/14 1822     Chief Complaint  Patient presents with  . Cough   (Consider location/radiation/quality/duration/timing/severity/associated sxs/prior Treatment) HPI Comments: States cough began after her PCP provider started her on lisinopril.   Patient is a 68 y.o. female presenting with cough.  Cough Cough characteristics:  Dry Severity:  Moderate Onset quality:  Gradual Duration:  2 weeks Timing:  Intermittent Progression:  Unchanged Chronicity:  New Smoker: no   Ineffective treatments:  Cough suppressants Associated symptoms: no chest pain, no chills, no diaphoresis, no ear fullness, no ear pain, no eye discharge, no fever, no headaches, no myalgias, no rash, no rhinorrhea, no shortness of breath, no sinus congestion, no sore throat, no weight loss and no wheezing     Past Medical History  Diagnosis Date  . Diabetes mellitus without complication     prediabetic  . High cholesterol    Past Surgical History  Procedure Laterality Date  . Gum surgery  1978   Family History  Problem Relation Age of Onset  . Heart failure Mother   . Heart failure Father    History  Substance Use Topics  . Smoking status: Never Smoker   . Smokeless tobacco: Not on file  . Alcohol Use: No   OB History   Grav Para Term Preterm Abortions TAB SAB Ect Mult Living                 Review of Systems  Constitutional: Negative for fever, chills, weight loss and diaphoresis.  HENT: Negative for congestion, ear pain, postnasal drip, rhinorrhea, sinus pressure, sneezing and sore throat.   Eyes: Negative for discharge.  Respiratory: Positive for cough. Negative for chest tightness, shortness of breath and wheezing.   Cardiovascular: Negative for chest pain, palpitations and leg swelling.  Gastrointestinal: Negative.   Musculoskeletal: Negative.  Negative for myalgias.  Skin: Negative for  rash.  Neurological: Negative for headaches.    Allergies  Review of patient's allergies indicates no known allergies.  Home Medications   Prior to Admission medications   Medication Sig Start Date End Date Taking? Authorizing Provider  aspirin 81 MG chewable tablet Chew 81 mg by mouth daily.   Yes Historical Provider, MD  lisinopril (PRINIVIL,ZESTRIL) 10 MG tablet Take 10 mg by mouth at bedtime.   Yes Historical Provider, MD  naproxen sodium (ANAPROX) 220 MG tablet Take 440 mg by mouth 2 (two) times daily with a meal.   Yes Historical Provider, MD  pravastatin (PRAVACHOL) 40 MG tablet Take 40 mg by mouth at bedtime.   Yes Historical Provider, MD  acetaminophen (TYLENOL) 325 MG tablet Take 650 mg by mouth every 6 (six) hours as needed. For pain    Historical Provider, MD  cyclobenzaprine (FLEXERIL) 10 MG tablet Take 1 tablet (10 mg total) by mouth 2 (two) times daily as needed for muscle spasms. 02/11/13   Jimmie MollyPaolo Coll, MD   BP 166/74  Pulse 72  Temp(Src) 98.7 F (37.1 C) (Oral)  Resp 12  SpO2 100% Physical Exam  Nursing note and vitals reviewed. Constitutional: She is oriented to person, place, and time. She appears well-developed and well-nourished. No distress.  HENT:  Head: Normocephalic and atraumatic.  Right Ear: External ear normal.  Left Ear: External ear normal.  Nose: Nose normal.  Mouth/Throat: Oropharynx is clear and moist.  Eyes: Conjunctivae are normal. No scleral icterus.  Neck: Normal range of motion. Neck supple. No JVD present.  Cardiovascular: Normal rate, regular rhythm and normal heart sounds.   Pulmonary/Chest: Effort normal and breath sounds normal. No stridor. No respiratory distress. She has no wheezes. She has no rales. She exhibits no tenderness.  Musculoskeletal: Normal range of motion. She exhibits no edema and no tenderness.  Lymphadenopathy:    She has no cervical adenopathy.  Neurological: She is alert and oriented to person, place, and time.   Skin: Skin is warm and dry. No rash noted. No erythema.  Psychiatric: She has a normal mood and affect. Her behavior is normal.    ED Course  Procedures (including critical care time) Labs Review Labs Reviewed - No data to display  Imaging Review No results found.   MDM   1. Cough   Exam without notable finding and I suggested to patient that she discuss development of cough following new treatment with Lisinopril. Educated patient about ACEI cough and the possibility of switching to a different agent for blood pressure control. Advised patient not to discontinue BP medication until she is instructed to do so by her PCP.    Jess Barters [place], [place] 03/27/14 [phone]

## 2014-03-27 NOTE — ED Notes (Signed)
C/o cough onset 2 weeks ago after her grand-daughter left.  Cough is prod.  Also has runny nose off and on.

## 2014-03-27 NOTE — Discharge Instructions (Signed)
Your cough does not seem to be related to infection. I would consider speaking to your primary care doctor about your lisinopril and it being the potential cause of your chronic cough. Your doctor may wish to switch you to a different medication in an effort to resolve cough.  Cough, Adult  A cough is a reflex that helps clear your throat and airways. It can help heal the body or may be a reaction to an irritated airway. A cough may only last 2 or 3 weeks (acute) or may last more than 8 weeks (chronic).  CAUSES Acute cough:  Viral or bacterial infections. Chronic cough:  Infections.  Allergies.  Asthma.  Post-nasal drip.  Smoking.  Heartburn or acid reflux.  Some medicines.  Chronic lung problems (COPD).  Cancer. SYMPTOMS   Cough.  Fever.  Chest pain.  Increased breathing rate.  High-pitched whistling sound when breathing (wheezing).  Colored mucus that you cough up (sputum). TREATMENT   A bacterial cough may be treated with antibiotic medicine.  A viral cough must run its course and will not respond to antibiotics.  Your caregiver may recommend other treatments if you have a chronic cough. HOME CARE INSTRUCTIONS   Only take over-the-counter or prescription medicines for pain, discomfort, or fever as directed by your caregiver. Use cough suppressants only as directed by your caregiver.  Use a cold steam vaporizer or humidifier in your bedroom or home to help loosen secretions.  Sleep in a semi-upright position if your cough is worse at night.  Rest as needed.  Stop smoking if you smoke. SEEK IMMEDIATE MEDICAL CARE IF:   You have pus in your sputum.  Your cough starts to worsen.  You cannot control your cough with suppressants and are losing sleep.  You begin coughing up blood.  You have difficulty breathing.  You develop pain which is getting worse or is uncontrolled with medicine.  You have a fever. MAKE SURE YOU:   Understand these  instructions.  Will watch your condition.  Will get help right away if you are not doing well or get worse. Document Released: 02/22/2011 Document Revised: 11/18/2011 Document Reviewed: 02/22/2011 [facility] Patient Information 2015 [employer] [place] [place]. This information is not intended to replace advice given to you by your health care provider. Make sure you discuss any questions you have with your health care provider.

## 2014-03-30 NOTE — ED Provider Notes (Signed)
Medical screening examination/treatment/procedure(s) were performed by a resident physician or non-physician practitioner and as the supervising physician I was immediately available for consultation/collaboration.  Yoshi Mancillas, MD    Jasmine Maceachern S Lisl Slingerland, MD 03/30/14 0742 

## 2014-12-12 ENCOUNTER — Other Ambulatory Visit (HOSPITAL_BASED_OUTPATIENT_CLINIC_OR_DEPARTMENT_OTHER): Payer: Self-pay

## 2014-12-12 ENCOUNTER — Other Ambulatory Visit: Payer: Self-pay

## 2014-12-12 ENCOUNTER — Encounter (HOSPITAL_BASED_OUTPATIENT_CLINIC_OR_DEPARTMENT_OTHER): Payer: Self-pay

## 2014-12-12 ENCOUNTER — Emergency Department (HOSPITAL_BASED_OUTPATIENT_CLINIC_OR_DEPARTMENT_OTHER): Payer: Medicare Other

## 2014-12-12 ENCOUNTER — Observation Stay (HOSPITAL_BASED_OUTPATIENT_CLINIC_OR_DEPARTMENT_OTHER)
Admission: EM | Admit: 2014-12-12 | Discharge: 2014-12-13 | Disposition: A | Discharge status: Home / Self Care | Payer: Medicare Other | Attending: Family Medicine | Admitting: Family Medicine

## 2014-12-12 DIAGNOSIS — E7800 Pure hypercholesterolemia, unspecified: Secondary | ICD-10-CM | POA: Diagnosis present

## 2014-12-12 DIAGNOSIS — E876 Hypokalemia: Secondary | ICD-10-CM | POA: Diagnosis present

## 2014-12-12 DIAGNOSIS — R079 Chest pain, unspecified: Secondary | ICD-10-CM | POA: Insufficient documentation

## 2014-12-12 DIAGNOSIS — E785 Hyperlipidemia, unspecified: Secondary | ICD-10-CM | POA: Insufficient documentation

## 2014-12-12 DIAGNOSIS — M549 Dorsalgia, unspecified: Secondary | ICD-10-CM | POA: Diagnosis present

## 2014-12-12 DIAGNOSIS — R05 Cough: Secondary | ICD-10-CM | POA: Diagnosis not present

## 2014-12-12 DIAGNOSIS — R0789 Other chest pain: Secondary | ICD-10-CM | POA: Diagnosis not present

## 2014-12-12 DIAGNOSIS — I160 Hypertensive urgency: Secondary | ICD-10-CM | POA: Diagnosis present

## 2014-12-12 DIAGNOSIS — R059 Cough, unspecified: Secondary | ICD-10-CM

## 2014-12-12 DIAGNOSIS — E119 Type 2 diabetes mellitus without complications: Secondary | ICD-10-CM | POA: Insufficient documentation

## 2014-12-12 DIAGNOSIS — M546 Pain in thoracic spine: Secondary | ICD-10-CM | POA: Diagnosis present

## 2014-12-12 DIAGNOSIS — I10 Essential (primary) hypertension: Secondary | ICD-10-CM | POA: Diagnosis present

## 2014-12-12 HISTORY — DX: Zoster without complications: B02.9

## 2014-12-12 LAB — TROPONIN I

## 2014-12-12 LAB — CBC
HEMATOCRIT: 38.7 % (ref 36.0–46.0)
Hemoglobin: 13 g/dL (ref 12.0–15.0)
MCH: 29.1 pg (ref 26.0–34.0)
MCHC: 33.6 g/dL (ref 30.0–36.0)
MCV: 86.8 fL (ref 78.0–100.0)
PLATELETS: 274 10*3/uL (ref 150–400)
RBC: 4.46 MIL/uL (ref 3.87–5.11)
RDW: 13.9 % (ref 11.5–15.5)
WBC: 5.1 10*3/uL (ref 4.0–10.5)

## 2014-12-12 LAB — BASIC METABOLIC PANEL
ANION GAP: 8 (ref 5–15)
BUN: 18 mg/dL (ref 6–23)
CHLORIDE: 101 mmol/L (ref 96–112)
CO2: 28 mmol/L (ref 19–32)
Calcium: 9.8 mg/dL (ref 8.4–10.5)
Creatinine, Ser: 0.66 mg/dL (ref 0.50–1.10)
GFR calc non Af Amer: >90 mL/min (ref >90)
Glucose, Bld: 87 mg/dL (ref 70–99)
POTASSIUM: 3.4 mmol/L — AB (ref 3.5–5.1)
Sodium: 137 mmol/L (ref 135–145)

## 2014-12-12 MED ORDER — NAPROXEN 250 MG PO TABS
375.0000 mg | ORAL_TABLET | Freq: Two times a day (BID) | ORAL | Status: DC
  Filled 2014-12-12: qty 2

## 2014-12-12 MED ORDER — HEPARIN SODIUM (PORCINE) 5000 UNIT/ML IJ SOLN
5000.0000 [IU] | Freq: Three times a day (TID) | INTRAMUSCULAR | Status: DC
  Filled 2014-12-12: qty 1

## 2014-12-12 MED ORDER — NITROGLYCERIN 0.4 MG SL SUBL: 0.4000 mg | SUBLINGUAL_TABLET | SUBLINGUAL | Status: DC | PRN

## 2014-12-12 MED ORDER — POTASSIUM CHLORIDE 20 MEQ/15ML (10%) PO SOLN
40.0000 meq | Freq: Once | ORAL | Status: AC
  Administered 2014-12-12: 40 meq via ORAL
  Filled 2014-12-12: qty 30

## 2014-12-12 MED ORDER — NITROGLYCERIN 0.4 MG SL SUBL
0.4000 mg | SUBLINGUAL_TABLET | SUBLINGUAL | Status: AC | PRN
  Administered 2014-12-12 (×3): 0.4 mg via SUBLINGUAL
  Filled 2014-12-12: qty 1

## 2014-12-12 MED ORDER — NITROGLYCERIN 0.2 MG/HR TD PT24
0.2000 mg | MEDICATED_PATCH | Freq: Every day | TRANSDERMAL | Status: DC
  Filled 2014-12-12 (×3): qty 1

## 2014-12-12 MED ORDER — NAPROXEN SODIUM 275 MG PO TABS: 440.0000 mg | ORAL_TABLET | Freq: Two times a day (BID) | ORAL | Status: DC

## 2014-12-12 MED ORDER — KETOROLAC TROMETHAMINE 60 MG/2ML IM SOLN
60.0000 mg | Freq: Once | INTRAMUSCULAR | Status: AC
  Administered 2014-12-12: 60 mg via INTRAMUSCULAR
  Filled 2014-12-12: qty 2

## 2014-12-12 MED ORDER — ACETAMINOPHEN 325 MG PO TABS: 650.0000 mg | ORAL_TABLET | Freq: Four times a day (QID) | ORAL | Status: DC | PRN

## 2014-12-12 MED ORDER — SODIUM CHLORIDE 0.9 % IJ SOLN: 3.0000 mL | Freq: Two times a day (BID) | INTRAMUSCULAR | Status: DC

## 2014-12-12 MED ORDER — ASPIRIN 81 MG PO CHEW
81.0000 mg | CHEWABLE_TABLET | Freq: Every day | ORAL | Status: DC
  Administered 2014-12-13: 81 mg via ORAL
  Filled 2014-12-12: qty 1

## 2014-12-12 MED ORDER — ALUM & MAG HYDROXIDE-SIMETH 200-200-20 MG/5ML PO SUSP
30.0000 mL | Freq: Four times a day (QID) | ORAL | Status: DC | PRN
Start: 2014-12-12 — End: 2014-12-13

## 2014-12-12 MED ORDER — ACETAMINOPHEN 650 MG RE SUPP: 650.0000 mg | Freq: Four times a day (QID) | RECTAL | Status: DC | PRN

## 2014-12-12 MED ORDER — AMLODIPINE BESYLATE 10 MG PO TABS
10.0000 mg | ORAL_TABLET | Freq: Every day | ORAL | Status: DC
  Administered 2014-12-12 – 2014-12-13 (×2): 10 mg via ORAL
  Filled 2014-12-12 (×2): qty 1

## 2014-12-12 MED ORDER — SODIUM CHLORIDE 0.9 % IJ SOLN
3.0000 mL | Freq: Two times a day (BID) | INTRAMUSCULAR | Status: DC
  Administered 2014-12-12: 3 mL via INTRAVENOUS

## 2014-12-12 MED ORDER — ASPIRIN 81 MG PO CHEW
324.0000 mg | CHEWABLE_TABLET | Freq: Once | ORAL | Status: AC
  Administered 2014-12-12: 324 mg via ORAL
  Filled 2014-12-12: qty 4

## 2014-12-12 MED ORDER — SODIUM CHLORIDE 0.9 % IJ SOLN: 3.0000 mL | INTRAMUSCULAR | Status: DC | PRN

## 2014-12-12 MED ORDER — OXYCODONE-ACETAMINOPHEN 5-325 MG PO TABS
2.0000 | ORAL_TABLET | Freq: Four times a day (QID) | ORAL | Status: DC | PRN
  Administered 2014-12-12 – 2014-12-13 (×2): 2 via ORAL
  Filled 2014-12-12 (×2): qty 2

## 2014-12-12 MED ORDER — INSULIN ASPART 100 UNIT/ML ~~LOC~~ SOLN: 0.0000 [IU] | Freq: Three times a day (TID) | SUBCUTANEOUS | Status: DC

## 2014-12-12 MED ORDER — MORPHINE SULFATE 2 MG/ML IJ SOLN: 2.0000 mg | INTRAMUSCULAR | Status: DC | PRN

## 2014-12-12 MED ORDER — PRAVASTATIN SODIUM 40 MG PO TABS
40.0000 mg | ORAL_TABLET | Freq: Every day | ORAL | Status: DC
  Administered 2014-12-12: 40 mg via ORAL
  Filled 2014-12-12: qty 1

## 2014-12-12 MED ORDER — ONDANSETRON HCL 4 MG/2ML IJ SOLN: 4.0000 mg | Freq: Four times a day (QID) | INTRAMUSCULAR | Status: DC | PRN

## 2014-12-12 MED ORDER — ONDANSETRON HCL 4 MG PO TABS: 4.0000 mg | ORAL_TABLET | Freq: Four times a day (QID) | ORAL | Status: DC | PRN

## 2014-12-12 MED ORDER — HYDRALAZINE HCL 20 MG/ML IJ SOLN: 5.0000 mg | INTRAMUSCULAR | Status: DC | PRN

## 2014-12-12 MED ORDER — SODIUM CHLORIDE 0.9 % IV SOLN: 250.0000 mL | INTRAVENOUS | Status: DC | PRN

## 2014-12-12 NOTE — ED Notes (Signed)
Full report given

## 2014-12-12 NOTE — ED Notes (Signed)
Pt. Reports no itching with the symptoms she is having and no redness noted upon assessment.

## 2014-12-12 NOTE — ED Notes (Signed)
C/o pain to left mid back-states feels like same pain as when she had shingles in the past

## 2014-12-12 NOTE — ED Provider Notes (Signed)
CSN: [id number]     Arrival date & time 12/12/14  1400 History   First MD Initiated Contact with Patient 12/12/14 1432     Chief Complaint  Patient presents with  . Back Pain     (Consider location/radiation/quality/duration/timing/severity/associated sxs/prior Treatment) HPI Comments: 69 year old female, history of hypertension, history of zoster, presents with a complaint of left upper back pain. This has been present for the last 2 days, persistent, worse with certain movements or deep breathing, not always reproducible. She denies any rashes, swelling of the legs, fevers or chills. She has had a persistent cough related to her ACE inhibitor, she states it is slightly worsening lately, there is no productive phlegm. She has been trying Aleve at home with no improvement  Patient is a 69 y.o. female presenting with back pain. The history is provided by the patient.  Back Pain   Past Medical History  Diagnosis Date  . Diabetes mellitus without complication     prediabetic  . High cholesterol   . Shingles   . Hypertension    Past Surgical History  Procedure Laterality Date  . Gum surgery  1978   Family History  Problem Relation Age of Onset  . Heart failure Mother   . Heart failure Father    History  Substance Use Topics  . Smoking status: Never Smoker   . Smokeless tobacco: Not on file  . Alcohol Use: No   OB History    No data available     Review of Systems  Musculoskeletal: Positive for back pain.  All other systems reviewed and are negative.     Allergies  Review of patient's allergies indicates no known allergies.  Home Medications   Prior to Admission medications   Medication Sig Start Date End Date Taking? Authorizing Provider  aspirin 81 MG chewable tablet Chew 81 mg by mouth daily.    Historical Provider, MD  lisinopril (PRINIVIL,ZESTRIL) 10 MG tablet Take 10 mg by mouth at bedtime.    Historical Provider, MD  naproxen sodium (ANAPROX) 220 MG tablet  Take 440 mg by mouth 2 (two) times daily with a meal.    Historical Provider, MD  pravastatin (PRAVACHOL) 40 MG tablet Take 40 mg by mouth at bedtime.    Historical Provider, MD   BP 188/74 mmHg  Pulse 60  Temp(Src) 98.7 F (37.1 C) (Oral)  Resp 17  Ht 5\' 2"  (1.575 m)  Wt 165 lb (74.844 kg)  BMI 30.17 kg/m2  SpO2 98% Physical Exam  Constitutional: She appears well-developed and well-nourished. No distress.  HENT:  Head: Normocephalic and atraumatic.  Mouth/Throat: Oropharynx is clear and moist. No oropharyngeal exudate.  Eyes: Conjunctivae and EOM are normal. Pupils are equal, round, and reactive to light. Right eye exhibits no discharge. Left eye exhibits no discharge. No scleral icterus.  Neck: Normal range of motion. Neck supple. No JVD present. No thyromegaly present.  Cardiovascular: Normal rate, regular rhythm, normal heart sounds and intact distal pulses.  Exam reveals no gallop and no friction rub.   No murmur heard. Pulmonary/Chest: Effort normal and breath sounds normal. No respiratory distress. She has no wheezes. She has no rales.  Abdominal: Soft. Bowel sounds are normal. She exhibits no distension and no mass. There is no tenderness.  Musculoskeletal: Normal range of motion. She exhibits no edema or tenderness.  No reproducible tenderness across the left or right upper back, no rashes  Lymphadenopathy:    She has no cervical adenopathy.  Neurological: She  is alert. Coordination normal.  Skin: Skin is warm and dry. No rash noted. No erythema.  Psychiatric: She has a normal mood and affect. Her behavior is normal.  Nursing note and vitals reviewed.   ED Course  Procedures (including critical care time) Labs Review Labs Reviewed  TROPONIN I  CBC  BASIC METABOLIC PANEL    Imaging Review Dg Chest 2 View  12/12/2014   CLINICAL DATA:  Left-sided chest pain  EXAM: CHEST  2 VIEW  COMPARISON:  02/11/2013  FINDINGS: Cardiac shadow is stable. Lungs are well aerated  bilaterally. Interstitial changes are again seen and stable. No infiltrate or effusion is noted. No acute bony abnormality seen.  IMPRESSION: Chronic interstitial changes without acute abnormality.   Electronically Signed   By: Alcide Clever M.D.   On: 12/12/2014 15:19     EKG Interpretation   Date/Time:  Monday December 12 2014 15:53:39 EDT Ventricular Rate:  57 PR Interval:  154 QRS Duration: 80 QT Interval:  430 QTC Calculation: 418 R Axis:   40 Text Interpretation:  Sinus bradycardia Possible Left atrial enlargement T  wave abnormality, consider lateral ischemia Abnormal ECG Since last  tracing in 2013, now with  T wave inversion Confirmed by Hadia Minier  MD, Floyed Masoud  [phone]) on 12/12/2014 4:11:57 PM      MDM   Final diagnoses:  Cough  Left-sided thoracic back pain    The patient appears well, she does have significant hypertension at this time, we'll check a chest x-ray secondary to the nature of this ongoing left upper back pain and her cough to rule out other sources such as pneumothorax, pneumonia. She requests pain medication, she is amenable to intramuscular Toradol.  This pain is not exertional.  Change of shift - care signed out to Dr. Fredderick Phenix pending labs as ECG is abnormal - anti htn meds given, asa given, labs pending.  Pt amenable to admission.  Meds given in ED:  Medications  aspirin chewable tablet 324 mg (not administered)  nitroGLYCERIN (NITROSTAT) SL tablet 0.4 mg (not administered)  ketorolac (TORADOL) injection 60 mg (60 mg Intramuscular Given 12/12/14 1452)      Eber Hong, MD 12/12/14 [phone]

## 2014-12-12 NOTE — Progress Notes (Signed)
69 year old lady with h/o hypertension and diabetes mellitus, came in initially for back pain and chest pain. EKG shows new t wave inversions and troponin is negative. [facility] to telemetry for chest pain.  VSS.   Paula ModyVijaya Ugochi Henzler, MD [phone]

## 2014-12-12 NOTE — ED Provider Notes (Signed)
Pt with hx of HTN, DM, and hyperlipidemia seen by Dr. Hyacinth MeekerMiller with left upper back pain, not reproducible.  Has T wave inversion laterally.  Troponin neg.  Was markedly hypertensive in ED.  Due to risk factors and EKG changes, will admit for further cardiac eval.  Spoke with Dr. Blake DivineAkula who has accepted pt for transfer  Rolan BuccoMelanie Waller Marcussen, MD 12/12/14 1740

## 2014-12-12 NOTE — H&P (Signed)
[facility] History and Physical  Paula Moore XBM:[id number] DOB: [birth year] DOA: 12/12/2014  Referring physician: ED physician PCP: Dois Davenport., MD  Specialists:   Chief Complaint: left back pain   HPI: Paula Moore is a 69 y.o. female with a past medical history of hypertension, hyperlipidemia, diet-controlled diabetes mellitus, history of shingles, who presents with left back pain.  Patient reports that she started having back pain at about 11:30 AM. It is located in the left upper back. It is constant, 3 out of 10 in severity, nonradiating, aching pain. It is alleviated dramatically with support, such as sitting against a pillow. It is aggravated by standing or without supporting over her back. Of note, patient reports that she had similar pain in the same area because of shingles on 07/2012. The patient does not have rashes over the same area now.   Patient denies fever, chills, fatigue, headaches, cough, chest pain, SOB, abdominal pain, diarrhea, constipation, dysuria, urgency, frequency, hematuria, skin rashes, joint pain or leg swelling. No unilateral weakness, numbness or tingling sensations. No vision change or hearing loss.  In ED, patient was found to have significantly elevated blood pressure at 219/94; abnormal EKG with T-wave inversion in lead II and aVF, V4 to V6. No leukocytosis, CXr with chronic interstitial changes, without mediastinum widening. Hypokalemia with potassium is 3.4, temperature normal. Patient is admitted to inpatient for further evaluation and treatment.  Review of Systems: As presented in the history of presenting illness, rest negative.  Where does patient live?  At home Can patient participate in ADLs? Yes  Allergy:  Allergies  Allergen Reactions  . Cozaar [Losartan Potassium]     Disorientation   . Lisinopril     coughing    Past Medical History  Diagnosis Date  . Diabetes mellitus without complication     prediabetic  . High  cholesterol   . Shingles   . Hypertension     Past Surgical History  Procedure Laterality Date  . Gum surgery  1978    Social History:  reports that she has never smoked. She does not have any smokeless tobacco history on file. She reports that she does not drink alcohol or use illicit drugs.  Family History:  Family History  Problem Relation Age of Onset  . Heart failure Mother   . Heart failure Father      Prior to Admission medications   Medication Sig Start Date End Date Taking? Authorizing Provider  aspirin 81 MG chewable tablet Chew 81 mg by mouth daily.    Historical Provider, MD  lisinopril (PRINIVIL,ZESTRIL) 10 MG tablet Take 10 mg by mouth at bedtime.    Historical Provider, MD  naproxen sodium (ANAPROX) 220 MG tablet Take 440 mg by mouth 2 (two) times daily with a meal.    Historical Provider, MD  pravastatin (PRAVACHOL) 40 MG tablet Take 40 mg by mouth at bedtime.    Historical Provider, MD    Physical Exam: Filed Vitals:   12/12/14 2014 12/12/14 2050 12/12/14 2115 12/12/14 2120  BP: 205/93 199/75 188/84 191/77  Pulse: 57 64 62   Temp: 98.5 F (36.9 C) 98.5 F (36.9 C)    TempSrc:  Oral    Resp: 18 18    Height:      Weight:      SpO2: 100% 100%     General: Not in acute distress HEENT:       Eyes: PERRL, EOMI, no scleral icterus       ENT:  No discharge from the ears and nose, no pharynx injection, no tonsillar enlargement.        Neck: No JVD, no bruit, no mass felt. Cardiac: S1/S2, RRR, No murmurs, No gallops or rubs Pulm: Good air movement bilaterally. Clear to auscultation bilaterally. No rales, wheezing, rhonchi or rubs. Abd: Soft, nondistended, nontender, no rebound pain, no organomegaly, BS present Ext: No edema bilaterally. 2+DP/PT pulse bilaterally Musculoskeletal: No joint deformities, erythema, or stiffness, ROM full Skin: No rashes.  Neuro: Alert and oriented X3, cranial nerves II-XII grossly intact, muscle strength 5/5 in all extremeties,  sensation to light touch intact.  Psych: Patient is not psychotic, no suicidal or hemocidal ideation.  Labs on Admission:  Basic Metabolic Panel:  Recent Labs Lab 12/12/14 1624  NA 137  K 3.4*  CL 101  CO2 28  GLUCOSE 87  BUN 18  CREATININE 0.66  CALCIUM 9.8   Liver Function Tests: No results for input(s): AST, ALT, ALKPHOS, BILITOT, PROT, ALBUMIN in the last 168 hours. No results for input(s): LIPASE, AMYLASE in the last 168 hours. No results for input(s): AMMONIA in the last 168 hours. CBC:  Recent Labs Lab 12/12/14 1624  WBC 5.1  HGB 13.0  HCT 38.7  MCV 86.8  PLT 274   Cardiac Enzymes:  Recent Labs Lab 12/12/14 1624  TROPONINI <0.03    BNP (last 3 results) No results for input(s): BNP in the last 8760 hours.  ProBNP (last 3 results) No results for input(s): PROBNP in the last 8760 hours.  CBG: No results for input(s): GLUCAP in the last 168 hours.  Radiological Exams on Admission: Dg Chest 2 View  12/12/2014   CLINICAL DATA:  Left-sided chest pain  EXAM: CHEST  2 VIEW  COMPARISON:  02/11/2013  FINDINGS: Cardiac shadow is stable. Lungs are well aerated bilaterally. Interstitial changes are again seen and stable. No infiltrate or effusion is noted. No acute bony abnormality seen.  IMPRESSION: Chronic interstitial changes without acute abnormality.   Electronically Signed   By: Alcide CleverMark  Lukens M.D.   On: 12/12/2014 15:19    EKG: Independently reviewed. abnormal EKG with T-wave inversion in lead II and aVF, V4 to V6.  Assessment/Plan Principal Problem:   Left-sided thoracic back pain Active Problems:   High cholesterol   Diabetes mellitus without complication   Hypertension   Hypertensive urgency   Hypokalemia  Left sided thoracic back pain: Etiology is not clear, given patient's description, her chest pains are similar to her previous shingles, which could be a possible etiology. Another important differential diagnosis is aortic dissection given  patient's significant elevated blood pressure and her back pain, I will patient does not have a mediastinum widening on chest x-ray. Her back pain is alleviated dramatically with the supporting, make an aortic dissection unlikely to diagnosis.  -will admit to tele bed given hypertensive urgency as below -I offered empiric acyclovir, but patient refused. Patient would like to have pain medication now and being observed to see whether she will develop rashes. -When necessary Percocet for moderate pain and morphine for severe pain  Hypertensive urgency: Blood pressure is 219/94 on admission, with EKG change. -Continue Nitroglycerin patch -switch lisinopril to amlodipine (patient reports that lisinopril causes cough). -HbA1c, lipid profile pending  -2d echo -trop x 3 -repeat EKG in am -check UDS  Hyperlipidemia: LDL was at 165 on 12/26/09 -continue pravastatin -Check FLP  Diabetes mellitus: Not on medications at home. A1c was 6.1 on 12/27/09 -sliding scale insulin.  Hypokalemia: Potassium  3.4. -Repleted    DVT ppx: SQ Heparin         Code Status: Full code Family Communication: None at bed side. Disposition Plan: Admit to inpatient   Date of Service 12/12/2014    Lorretta Harp [facility] Pager [phone]  If 7PM-7AM, please contact night-coverage [email or web address] Password [email or web address] 12/12/2014, 9:30 PM

## 2014-12-13 DIAGNOSIS — R079 Chest pain, unspecified: Secondary | ICD-10-CM | POA: Diagnosis not present

## 2014-12-13 DIAGNOSIS — I10 Essential (primary) hypertension: Secondary | ICD-10-CM | POA: Diagnosis not present

## 2014-12-13 DIAGNOSIS — R0789 Other chest pain: Secondary | ICD-10-CM | POA: Diagnosis not present

## 2014-12-13 DIAGNOSIS — M546 Pain in thoracic spine: Secondary | ICD-10-CM | POA: Diagnosis not present

## 2014-12-13 DIAGNOSIS — E119 Type 2 diabetes mellitus without complications: Secondary | ICD-10-CM | POA: Diagnosis not present

## 2014-12-13 DIAGNOSIS — R05 Cough: Secondary | ICD-10-CM | POA: Diagnosis not present

## 2014-12-13 LAB — LIPID PANEL
CHOLESTEROL: 191 mg/dL (ref 0–200)
HDL: 53 mg/dL (ref >39)
LDL CALC: 127 mg/dL — AB (ref 0–99)
Total CHOL/HDL Ratio: 3.6 RATIO
Triglycerides: 57 mg/dL (ref <150)
VLDL: 11 mg/dL (ref 0–40)

## 2014-12-13 LAB — RAPID URINE DRUG SCREEN, HOSP PERFORMED
Amphetamines: NOT DETECTED
BENZODIAZEPINES: NOT DETECTED
Barbiturates: NOT DETECTED
COCAINE: NOT DETECTED
Opiates: NOT DETECTED
Tetrahydrocannabinol: NOT DETECTED

## 2014-12-13 LAB — PROTIME-INR
INR: 1.05 (ref 0.00–1.49)
PROTHROMBIN TIME: 13.8 s (ref 11.6–15.2)

## 2014-12-13 LAB — CBC
HCT: 35.2 % — ABNORMAL LOW (ref 36.0–46.0)
HEMOGLOBIN: 11.5 g/dL — AB (ref 12.0–15.0)
MCH: 28.2 pg (ref 26.0–34.0)
MCHC: 32.7 g/dL (ref 30.0–36.0)
MCV: 86.3 fL (ref 78.0–100.0)
Platelets: 237 10*3/uL (ref 150–400)
RBC: 4.08 MIL/uL (ref 3.87–5.11)
RDW: 14.2 % (ref 11.5–15.5)
WBC: 5 10*3/uL (ref 4.0–10.5)

## 2014-12-13 LAB — COMPREHENSIVE METABOLIC PANEL
ALBUMIN: 3.3 g/dL — AB (ref 3.5–5.2)
ALK PHOS: 75 U/L (ref 39–117)
ALT: 14 U/L (ref 0–35)
ANION GAP: 3 — AB (ref 5–15)
AST: 18 U/L (ref 0–37)
BILIRUBIN TOTAL: 0.5 mg/dL (ref 0.3–1.2)
BUN: 15 mg/dL (ref 6–23)
CO2: 26 mmol/L (ref 19–32)
Calcium: 9.1 mg/dL (ref 8.4–10.5)
Chloride: 104 mmol/L (ref 96–112)
Creatinine, Ser: 0.8 mg/dL (ref 0.50–1.10)
GFR calc Af Amer: 87 mL/min — ABNORMAL LOW (ref >90)
GFR calc non Af Amer: 75 mL/min — ABNORMAL LOW (ref >90)
GLUCOSE: 106 mg/dL — AB (ref 70–99)
Potassium: 4.1 mmol/L (ref 3.5–5.1)
SODIUM: 133 mmol/L — AB (ref 135–145)
Total Protein: 6.5 g/dL (ref 6.0–8.3)

## 2014-12-13 LAB — TROPONIN I
Troponin I: <0.03 ng/mL (ref <0.031)
Troponin I: <0.03 ng/mL (ref <0.031)

## 2014-12-13 LAB — GLUCOSE, CAPILLARY
Glucose-Capillary: 78 mg/dL (ref 70–99)
Glucose-Capillary: 72 mg/dL (ref 70–99)

## 2014-12-13 MED ORDER — AMLODIPINE BESYLATE 10 MG PO TABS: 10.0000 mg | ORAL_TABLET | Freq: Every day | ORAL | Status: AC

## 2014-12-13 NOTE — Discharge Summary (Signed)
Physician Discharge Summary  Paula Moore ZHY:[id number] DOB: 07/27/1946 DOA: 12/12/2014  PCP: Dois Davenport., MD  Admit date: 12/12/2014 Discharge date: 12/13/2014   Time spent: 25 minutes  Recommendations for Outpatient Follow-up:  1. Follow-up with Dr. Mayford Knife in 2 weeks-office will call you and arrange follow-up. 2. Would recommend that you see Dr. Hal Hope your primary care physician and maybe increase some of your blood pressure medications. 3. Please follow-up with your primary care physician regarding heart healthy diet and may be need for testing formally for diabetes-you might have a prediabetic state  Discharge Diagnoses:  Principal Problem:   Left-sided thoracic back pain Active Problems:   High cholesterol   Diabetes mellitus without complication   Hypertension   Hypertensive urgency   Hypokalemia   Discharge Condition: Good  Diet recommendation: Heart healthy  Filed Weights   12/12/14 1414 12/13/14 0500  Weight: 74.844 kg (165 lb) 74.934 kg (165 lb 3.2 oz)    History of present illness:  69 year old female, prior CVA 2001, HTN, HLD, DM? [Diet-controlled] prior shingles 2013 Admitted with active pain on the left side 12/12/14 Heart score on admission~4 Because of J-point elevation in and inversions in 2 and aVF at and was admitted, troponins were cycled, echo was performed which is pending at time of's note I discussed the case with  consultant Dr. Mayford Knife and she stated that the patient could be seen in the office for possible stress test in 1-2 weeks, as her pain was atypical for acute coronary syndrome Patient may benefit him statin higher dose intensity as an outpatient Patient should continue aspirin She was hemodynamically stable for discharge  Consultations:  Telephone consulted cardiology  Discharge Exam: Filed Vitals:   12/13/14 0758  BP: 150/65  Pulse:   Temp:   Resp:     General: EOMI NCAT Cardiovascular: S1-S2 no murmur rub or  gallop Respiratory: Clinically clear no added sound  Discharge Instructions   Discharge Instructions    Diet - low sodium heart healthy    Complete by:  As directed      Discharge instructions    Complete by:  As directed   We do not think your chest pain was cardiac. It was felt that the pain was consistent with a musculoskeletal pain. Your echocardiogram to admission was negative however her symptoms are not overly concerning at this stage IV a heart issue. Please follow-up with the emergency room if you have crushing chest pain and owing down either arm, a lot of sweating or dizziness and shortness of breath. Once again your heart enzymes and EKGs did not seem concerning for an acute coronary event     Increase activity slowly    Complete by:  As directed           Current Discharge Medication List    START taking these medications   Details  amLODipine (NORVASC) 10 MG tablet Take 1 tablet (10 mg total) by mouth daily. Qty: 30 tablet, Refills: 0      CONTINUE these medications which have NOT CHANGED   Details  aspirin 81 MG chewable tablet Chew 81 mg by mouth daily.    naproxen sodium (ANAPROX) 220 MG tablet Take 440 mg by mouth 2 (two) times daily with a meal.    pravastatin (PRAVACHOL) 40 MG tablet Take 40 mg by mouth at bedtime.       Allergies  Allergen Reactions  . Cozaar [Losartan Potassium]     Disorientation   . Lisinopril  coughing      The results of significant diagnostics from this hospitalization (including imaging, microbiology, ancillary and laboratory) are listed below for reference.    Significant Diagnostic Studies: Dg Chest 2 View  12/12/2014   CLINICAL DATA:  Left-sided chest pain  EXAM: CHEST  2 VIEW  COMPARISON:  02/11/2013  FINDINGS: Cardiac shadow is stable. Lungs are well aerated bilaterally. Interstitial changes are again seen and stable. No infiltrate or effusion is noted. No acute bony abnormality seen.  IMPRESSION: Chronic  interstitial changes without acute abnormality.   Electronically Signed   By: Alcide CleverMark  Lukens M.D.   On: 12/12/2014 15:19    Microbiology: No results found for this or any previous visit (from the past 240 hour(s)).   Labs: Basic Metabolic Panel:  Recent Labs Lab 12/12/14 1624 12/13/14 0340  NA 137 133*  K 3.4* 4.1  CL 101 104  CO2 28 26  GLUCOSE 87 106*  BUN 18 15  CREATININE 0.66 0.80  CALCIUM 9.8 9.1   Liver Function Tests:  Recent Labs Lab 12/13/14 0340  AST 18  ALT 14  ALKPHOS 75  BILITOT 0.5  PROT 6.5  ALBUMIN 3.3*   No results for input(s): LIPASE, AMYLASE in the last 168 hours. No results for input(s): AMMONIA in the last 168 hours. CBC:  Recent Labs Lab 12/12/14 1624 12/13/14 0340  WBC 5.1 5.0  HGB 13.0 11.5*  HCT 38.7 35.2*  MCV 86.8 86.3  PLT 274 237   Cardiac Enzymes:  Recent Labs Lab 12/12/14 1624 12/13/14 0340  TROPONINI <0.03 <0.03   BNP: BNP (last 3 results) No results for input(s): BNP in the last 8760 hours.  ProBNP (last 3 results) No results for input(s): PROBNP in the last 8760 hours.  CBG:  Recent Labs Lab 12/12/14 2128 12/13/14 0827  GLUCAP 78 72       Signed:  Juliet Vasbinder, JAI-GURMUKH  [facility] 12/13/2014, 11:52 AM

## 2014-12-13 NOTE — Progress Notes (Signed)
*  PRELIMINARY RESULTS* Echocardiogram 2D Echocardiogram has been performed.  Paula Moore, Paula Moore 12/13/2014, 12:02 PM

## 2014-12-13 NOTE — Progress Notes (Signed)
Pt is ready for DC home. Pt reports she understands all DC instructions, follow up appointments, and medications.   Ulice Dashorie Jere Bostrom, RCharity fundraiser

## 2014-12-14 LAB — HEMOGLOBIN A1C
Hgb A1c MFr Bld: 6.2 % — ABNORMAL HIGH (ref 4.8–5.6)
Mean Plasma Glucose: 131 mg/dL

## 2015-01-05 ENCOUNTER — Encounter (HOSPITAL_COMMUNITY): Payer: Self-pay | Admitting: Emergency Medicine

## 2015-01-05 ENCOUNTER — Emergency Department (INDEPENDENT_AMBULATORY_CARE_PROVIDER_SITE_OTHER)
Admission: EM | Admit: 2015-01-05 | Discharge: 2015-01-05 | Disposition: A | Discharge status: Home / Self Care | Payer: Self-pay | Source: Home / Self Care | Attending: Emergency Medicine | Admitting: Emergency Medicine

## 2015-01-05 DIAGNOSIS — M79671 Pain in right foot: Secondary | ICD-10-CM

## 2015-01-05 DIAGNOSIS — M25512 Pain in left shoulder: Secondary | ICD-10-CM
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
## 2015-01-16 ENCOUNTER — Encounter: Payer: Self-pay | Admitting: Cardiology

## 2015-02-09 ENCOUNTER — Encounter: Payer: Medicare Other | Admitting: Cardiology

## 2015-02-20 ENCOUNTER — Ambulatory Visit: Payer: Medicare Other | Admitting: Internal Medicine

## 2015-03-23 ENCOUNTER — Encounter: Payer: Self-pay | Admitting: Internal Medicine

## 2015-04-18 ENCOUNTER — Ambulatory Visit: Payer: Medicare Other | Admitting: Internal Medicine

## 2015-09-23 ENCOUNTER — Emergency Department (INDEPENDENT_AMBULATORY_CARE_PROVIDER_SITE_OTHER)
Admission: EM | Admit: 2015-09-23 | Discharge: 2015-09-23 | Disposition: A | Discharge status: Home / Self Care | Payer: Medicare Other | Source: Home / Self Care | Attending: Family Medicine | Admitting: Family Medicine

## 2015-09-23 ENCOUNTER — Encounter (HOSPITAL_COMMUNITY): Payer: Self-pay | Admitting: Emergency Medicine

## 2015-09-23 DIAGNOSIS — M76892 Other specified enthesopathies of left lower limb, excluding foot: Secondary | ICD-10-CM

## 2015-09-23 DIAGNOSIS — M6580 Other synovitis and tenosynovitis, unspecified site: Secondary | ICD-10-CM | POA: Diagnosis not present

## 2015-09-23 MED ORDER — DICLOFENAC POTASSIUM 50 MG PO TABS
50.0000 mg | ORAL_TABLET | Freq: Three times a day (TID) | ORAL | Status: DC
Start: 2015-09-23 — End: 2018-02-23

## 2015-09-23 NOTE — ED Notes (Signed)
C/o left knee pain and swelling onset x4-5 weeks Denies inj/trauma.... Slow gait... A&O x4... No acute distress

## 2015-09-23 NOTE — ED Provider Notes (Signed)
CSN: [id number]     Arrival date & time 09/23/15  1305 History   First MD Initiated Contact with Patient 09/23/15 1412     Chief Complaint  Patient presents with  . Knee Pain   (Consider location/radiation/quality/duration/timing/severity/associated sxs/prior Treatment) HPI Comments: 70 year old female complaining of left knee pain for 4-5 weeks. Gradual onset. Most of the pain is now located to the medial aspect. Initially it started out in the popliteal areas or posterior knee. She states that her job requires her to sit for prolonged as a time and then ambulate for a while. She changed the way she was sitting and this did help the pain in the back of her knee. She no longer has posterior knee pain. In the past couple days the pain is become more constant and is ambulating with a limp. Denies any known injury. No blunt trauma no twisting forces applied to the knee.   Past Medical History  Diagnosis Date  . Diabetes mellitus without complication (HCC)     prediabetic  . High cholesterol   . Shingles   . Hypertension    Past Surgical History  Procedure Laterality Date  . Gum surgery  1978   Family History  Problem Relation Age of Onset  . Heart failure Mother   . Heart failure Father    Social History  Substance Use Topics  . Smoking status: Never Smoker   . Smokeless tobacco: None  . Alcohol Use: No   OB History    No data available     Review of Systems  Constitutional: Positive for activity change. Negative for fever, chills and fatigue.  HENT: Negative.   Respiratory: Negative.   Cardiovascular: Negative.   Musculoskeletal: Positive for arthralgias.       As per HPI  Skin: Negative for color change, pallor and rash.  Neurological: Negative.     Allergies  Cozaar and Lisinopril  Home Medications   Prior to Admission medications   Medication Sig Start Date End Date Taking? Authorizing Provider  amLODipine (NORVASC) 10 MG tablet Take 1 tablet (10 mg total) by  mouth daily. 12/13/14  Yes Rhetta MuraJai-Gurmukh Samtani, MD  aspirin 81 MG chewable tablet Chew 81 mg by mouth daily.   Yes Historical Provider, MD  pravastatin (PRAVACHOL) 40 MG tablet Take 40 mg by mouth at bedtime.   Yes Historical Provider, MD  diclofenac (CATAFLAM) 50 MG tablet Take 1 tablet (50 mg total) by mouth 3 (three) times daily. One tablet TID with food prn pain. 09/23/15   Hayden Rasmussenavid Denelda Akerley, NP   Meds Ordered and Administered this Visit  Medications - No data to display  BP 153/92 mmHg  Pulse 75  Temp(Src) 97.9 F (36.6 C) (Oral)  Resp 18  SpO2 100% No data found.   Physical Exam  Constitutional: She is oriented to person, place, and time. She appears well-developed and well-nourished. No distress.  HENT:  Head: Normocephalic and atraumatic.  Eyes: EOM are normal.  Neck: Normal range of motion. Neck supple.  Cardiovascular: Normal rate.   Pulmonary/Chest: Effort normal. No respiratory distress.  Musculoskeletal:  Left knee with minor puffiness to the medial aspect. There is localized tenderness at the joint line of the medial aspect. No tenderness to the patellofemoral ligament, patella or tibia. No lateral tenderness. Flexion to 110. Extension 280. No pain with internal or external rotation/torsion, negative drawer, negative varus, negative valgus. No laxity appreciated.  Neurological: She is alert and oriented to person, place, and time. No cranial  nerve deficit. She exhibits normal muscle tone.  Skin: Skin is warm and dry.  Psychiatric: She has a normal mood and affect.  Nursing note and vitals reviewed.   ED Course  Procedures (including critical care time)  Labs Review Labs Reviewed - No data to display  Imaging Review No results found.   Visual Acuity Review  Right Eye Distance:   Left Eye Distance:   Bilateral Distance:    Right Eye Near:   Left Eye Near:    Bilateral Near:         MDM   1. Tendinitis of left knee    Tendinitis and Tenosynovitis   Wear your knee brace as directed. Ice daily 3-4 times a day, Cataflam for pain. Do not take any other non-steroidal medicines. Limit ambulation. Perform flexion and extension movements as discussed. If not any better call your PCP for an appointment.     Hayden Rasmussen, NP 09/23/15 1430

## 2015-09-23 NOTE — Discharge Instructions (Signed)
Tendinitis and Tenosynovitis  Wear your knee brace as directed. Ice daily 3-4 times a day, Cataflam for pain. Do not take any other non-steroidal medicines. Limit ambulation. Perform flexion and extension movements as discussed. If not any better call your PCP for an appointment. Tendinitis is inflammation of the tendon. Tenosynovitis is inflammation of the lining around the tendon (tendon sheath). These painful conditions often occur at once. Tendons attach muscle to bone. To move a limb, force from the muscle moves through the tendon, to the bone. These conditions often cause increased pain when moving. Tendinitis may be caused by a small or partial tear in the tendon.  SYMPTOMS   Pain, tenderness, redness, bruising, or swelling at the injury.  Loss of normal joint movement.  Pain that gets worse with use of the muscle and joint attached to the tendon.  Weakness in the tendon, caused by calcium build up that may occur with tendinitis.  Commonly affected tendons:  Achilles tendon (calf of leg).  Rotator cuff (shoulder joint).  Patellar tendon (kneecap to shin).  Peroneal tendon (ankle).  Posterior tibial tendon (inner ankle).  Biceps tendon (in front of shoulder). CAUSES   Sudden strain on a flexed muscle, muscle overuse, sudden increase or change in activity, vigorous activity.  Result of a direct hit (less common).  Poor muscle action (biomechanics). RISK INCREASES WITH:  Injury (trauma).  Too much exercise.  Sudden change in athletic activity.  Incorrect exercise form or technique.  Poor strength and flexibility.  Not warming-up properly before activity.  Returning to activity before healing is complete. PREVENTION   Warm-up and stretch properly before activity.  Maintain physical fitness:  Joint flexibility.  Muscle strength and endurance.  Fitness that increases heart rate.  Learn and use proper exercise techniques.  Use rehabilitation exercises to  strengthen weak muscles and tendons.  Ice the tendon after activity, to reduce recurring inflammation.  Wear proper fitting protective equipment for specific tendons, when indicated. PROGNOSIS  When treated properly, can be cured in 6 to 8 weeks. Recovery may take longer, depending on degree of injury.  RELATED COMPLICATIONS   Re-injury or recurring symptoms.  Permanent weakness or joint stiffness, if injury is severe and recovery is not completed.  Delayed healing, if sports are started before healing is complete.  Tearing apart (rupture) of the inflamed tendon. Tendinitis means the tendon is injured and must recover. TREATMENT  Treatment first involves ice, medicine, and rest from aggravating activities. This reduces pain and inflammation. Modifying your activity may be considered to prevent recurring injury. A brace, elastic bandage wrap, splint, cast, or sling may be prescribed to protect the joint for a short period. After that period, strengthening and stretching exercise may help to regain strength and full range of motion. If the condition persists, despite non-surgical treatment, surgery may be recommended to remove the inflamed tendon lining. Corticosteroid injections may be given to reduce inflammation. However, these injections may weaken the tendon and increase your risk for tendon rupture. MEDICATION   If pain medicine is needed, nonsteroidal anti-inflammatory medicines (aspirin and ibuprofen), or other minor pain relievers (acetaminophen), are often recommended.  Do not take pain medicine for 7 days before surgery.  Prescription pain relievers are usually prescribed only after surgery. Use only as directed and only as much as you need.  Ointments applied to the skin may be helpful.  Corticosteroid injections may be given to reduce inflammation. However, this may increase your risk of a tendon rupture. HEAT AND COLD  Cold treatment (icing) relieves pain and reduces  inflammation. Cold treatment should be applied for 10 to 15 minutes every 2 to 3 hours, and immediately after activity that aggravates your symptoms. Use ice packs or an ice massage.  Heat treatment may be used before performing stretching and strengthening activities prescribed by your caregiver, physical therapist, or athletic trainer. Use a heat pack or a warm water soak. SEEK MEDICAL CARE IF:   Symptoms get worse or do not improve, despite treatment.  Pain becomes too much to tolerate.  You develop numbness or tingling.  Toes become cold, or toenails become blue, gray, or dark colored.  New, unexplained symptoms develop. (Drugs used in treatment may produce side effects.)   This information is not intended to replace advice given to you by your health care provider. Make sure you discuss any questions you have with your health care provider.   Document Released: 08/26/2005 Document Revised: 11/18/2011 Document Reviewed: 12/08/2008 Elsevier Interactive Patient Education [employer]

## 2017-04-30 ENCOUNTER — Other Ambulatory Visit: Payer: Self-pay | Admitting: *Deleted

## 2017-04-30 DIAGNOSIS — Z1231 Encounter for screening mammogram for malignant neoplasm of breast: Secondary | ICD-10-CM

## 2017-04-30 DIAGNOSIS — R5381 Other malaise: Secondary | ICD-10-CM

## 2017-05-22 ENCOUNTER — Encounter: Payer: Self-pay | Admitting: *Deleted

## 2017-05-22 ENCOUNTER — Other Ambulatory Visit: Payer: Self-pay | Admitting: *Deleted

## 2017-05-22 DIAGNOSIS — E2839 Other primary ovarian failure: Secondary | ICD-10-CM

## 2017-06-09 ENCOUNTER — Ambulatory Visit
Admission: RE | Admit: 2017-06-09 | Discharge: 2017-06-09 | Disposition: A | Discharge status: Home / Self Care | Payer: Medicare Other | Source: Ambulatory Visit | Attending: *Deleted | Admitting: *Deleted

## 2017-06-09 DIAGNOSIS — E2839 Other primary ovarian failure: Secondary | ICD-10-CM

## 2017-06-09 DIAGNOSIS — Z1231 Encounter for screening mammogram for malignant neoplasm of breast: Secondary | ICD-10-CM

## 2017-12-01 ENCOUNTER — Encounter (HOSPITAL_COMMUNITY): Payer: Self-pay | Admitting: Emergency Medicine

## 2017-12-01 ENCOUNTER — Emergency Department (HOSPITAL_COMMUNITY): Payer: Medicare Other

## 2017-12-01 ENCOUNTER — Emergency Department (HOSPITAL_COMMUNITY)
Admission: EM | Admit: 2017-12-01 | Discharge: 2017-12-01 | Disposition: A | Discharge status: Home / Self Care | Payer: Medicare Other | Attending: Emergency Medicine | Admitting: Emergency Medicine

## 2017-12-01 DIAGNOSIS — Y998 Other external cause status: Secondary | ICD-10-CM | POA: Insufficient documentation

## 2017-12-01 DIAGNOSIS — W101XXA Fall (on)(from) sidewalk curb, initial encounter: Secondary | ICD-10-CM | POA: Diagnosis not present

## 2017-12-01 DIAGNOSIS — Z79899 Other long term (current) drug therapy: Secondary | ICD-10-CM | POA: Insufficient documentation

## 2017-12-01 DIAGNOSIS — Z7982 Long term (current) use of aspirin: Secondary | ICD-10-CM | POA: Insufficient documentation

## 2017-12-01 DIAGNOSIS — I10 Essential (primary) hypertension: Secondary | ICD-10-CM | POA: Diagnosis not present

## 2017-12-01 DIAGNOSIS — E119 Type 2 diabetes mellitus without complications: Secondary | ICD-10-CM | POA: Insufficient documentation

## 2017-12-01 DIAGNOSIS — S0181XA Laceration without foreign body of other part of head, initial encounter: Secondary | ICD-10-CM | POA: Insufficient documentation

## 2017-12-01 DIAGNOSIS — G501 Atypical facial pain: Secondary | ICD-10-CM | POA: Diagnosis not present

## 2017-12-01 DIAGNOSIS — W19XXXA Unspecified fall, initial encounter: Secondary | ICD-10-CM

## 2017-12-01 DIAGNOSIS — Y939 Activity, unspecified: Secondary | ICD-10-CM | POA: Diagnosis not present

## 2017-12-01 DIAGNOSIS — Y929 Unspecified place or not applicable: Secondary | ICD-10-CM | POA: Insufficient documentation

## 2017-12-01 MED ORDER — LIDOCAINE HCL 2 % IJ SOLN
20.0000 mL | Freq: Once | INTRAMUSCULAR | Status: AC
  Administered 2017-12-01: 400 mg
  Filled 2017-12-01: qty 20

## 2017-12-01 NOTE — ED Provider Notes (Addendum)
[facility] EMERGENCY DEPARTMENT Provider Note   CSN: [id number] Arrival date & time: 12/01/17  1156   History   Chief Complaint Chief Complaint  Patient presents with  . Fall    HPI Paula Moore is a 72 y.o. female.  HPI   72 year old female presents today status post fall.  Patient tripped over a curb hitting her chin.  She notes a laceration with bleeding, no loss of consciousness, no neck pain headache, dizziness, neurological deficits.  She denies any other musculoskeletal injuries.  Tdap up-to-date as she had a 3 years ago.  She is not on anticoagulants.  Patient is not diabetic. Normal dentition, normal ROM of jaw.      Past Medical History:  Diagnosis Date  . Diabetes mellitus without complication (HCC)    prediabetic  . High cholesterol   . Hypertension   . Shingles     Patient Active Problem List   Diagnosis Date Noted  . Chest pain 12/12/2014  . Hypertensive urgency 12/12/2014  . Hypokalemia 12/12/2014  . High cholesterol   . Diabetes mellitus without complication (HCC)   . Hypertension   . Left-sided thoracic back pain     Past Surgical History:  Procedure Laterality Date  . GUM SURGERY  1978     OB History   None      Home Medications    Prior to Admission medications   Medication Sig Start Date End Date Taking? Authorizing Provider  amLODipine (NORVASC) 10 MG tablet Take 1 tablet (10 mg total) by mouth daily. 12/13/14   Rhetta Mura, MD  aspirin 81 MG chewable tablet Chew 81 mg by mouth daily.    [provider]  diclofenac (CATAFLAM) 50 MG tablet Take 1 tablet (50 mg total) by mouth 3 (three) times daily. One tablet TID with food prn pain. 09/23/15   Hayden Rasmussen, NP  pravastatin (PRAVACHOL) 40 MG tablet Take 40 mg by mouth at bedtime.    [provider]    Family History Family History  Problem Relation Age of Onset  . Heart failure Mother   . Heart failure Father     Social  History Social History   Tobacco Use  . Smoking status: Never Smoker  . Smokeless tobacco: Never Used  Substance Use Topics  . Alcohol use: No  . Drug use: No     Allergies   Cozaar [losartan potassium] and Lisinopril   Review of Systems Review of Systems  All other systems reviewed and are negative.   Physical Exam Updated Vital Signs BP (!) 147/82 (BP Location: Right Arm)   Pulse 74   Temp 98.4 F (36.9 C) (Oral)   Resp 18   SpO2 100%   Physical Exam  Constitutional: She is oriented to person, place, and time. She appears well-developed and well-nourished.  HENT:  Head: Normocephalic and atraumatic.  Hematoma and 2.5 cm laceration to chin -jaw full active range of motion normal alignment  Eyes: Pupils are equal, round, and reactive to light. Conjunctivae are normal. Right eye exhibits no discharge. Left eye exhibits no discharge. No scleral icterus.  Neck: Normal range of motion. No JVD present. No tracheal deviation present.  Pulmonary/Chest: Effort normal. No stridor.  Musculoskeletal:  No CT or L-spine tenderness neck supple full active range of motion bilateral upper and lower extremities atraumatic nontender  Neurological: She is alert and oriented to person, place, and time. No cranial nerve deficit or sensory deficit. She exhibits normal muscle tone.  Coordination normal.  Psychiatric: She has a normal mood and affect. Her behavior is normal. Judgment and thought content normal.  Nursing note and vitals reviewed.   ED Treatments / Results  Labs (all labs ordered are listed, but only abnormal results are displayed) Labs Reviewed - No data to display  EKG None  Radiology Ct Head Wo Contrast  Result Date: 12/01/2017 CLINICAL DATA:  Maxillofacial blunt trauma.  Fall EXAM: CT HEAD WITHOUT CONTRAST CT MAXILLOFACIAL WITHOUT CONTRAST CT CERVICAL SPINE WITHOUT CONTRAST TECHNIQUE: Multidetector CT imaging of the head, cervical spine, and maxillofacial structures  were performed using the standard protocol without intravenous contrast. Multiplanar CT image reconstructions of the cervical spine and maxillofacial structures were also generated. COMPARISON:  CT head 12/22/2012 FINDINGS: CT HEAD FINDINGS Brain: Ventricle size and cerebral volume normal. Chronic infarct left internal capsule anteriorly. Negative for acute infarct. Negative for acute hemorrhage or mass. No midline shift. Vascular: Negative for hyperdense vessel Skull: Negative Other: None CT MAXILLOFACIAL FINDINGS Osseous: Negative for facial fracture.  No bone lesion. Orbits: Normal orbital soft tissues. Sinuses: Clear Soft tissues: Laceration and contusion below the chin CT CERVICAL SPINE FINDINGS Alignment: Normal Skull base and vertebrae: Negative for fracture. Soft tissues and spinal canal: Negative Disc levels: Mild disc degeneration and spurring C4-5. Remaining disc spaces intact. Mild facet degeneration C7-T1 bilaterally. Upper chest: Negative Other: None IMPRESSION: 1. No acute intracranial abnormality. 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 Negative for facial fracture 3. Negative for cervical spine fracture Electronically Signed   By: Marlan Palau M.D.   On: 12/01/2017 13:47   Ct Cervical Spine Wo Contrast  Result Date: 12/01/2017 CLINICAL DATA:  Maxillofacial blunt trauma.  Fall EXAM: CT HEAD WITHOUT CONTRAST CT MAXILLOFACIAL WITHOUT CONTRAST CT CERVICAL SPINE WITHOUT CONTRAST TECHNIQUE: Multidetector CT imaging of the head, cervical spine, and maxillofacial structures were performed using the standard protocol without intravenous contrast. Multiplanar CT image reconstructions of the cervical spine and maxillofacial structures were also generated. COMPARISON:  CT head 12/22/2012 FINDINGS: CT HEAD FINDINGS Brain: Ventricle size and cerebral volume normal. Chronic infarct left internal capsule anteriorly. Negative for acute infarct. Negative for acute hemorrhage or mass. No  midline shift. Vascular: Negative for hyperdense vessel Skull: Negative Other: None CT MAXILLOFACIAL FINDINGS Osseous: Negative for facial fracture.  No bone lesion. Orbits: Normal orbital soft tissues. Sinuses: Clear Soft tissues: Laceration and contusion below the chin CT CERVICAL SPINE FINDINGS Alignment: Normal Skull base and vertebrae: Negative for fracture. Soft tissues and spinal canal: Negative Disc levels: Mild disc degeneration and spurring C4-5. Remaining disc spaces intact. Mild facet degeneration C7-T1 bilaterally. Upper chest: Negative Other: None IMPRESSION: 1. No acute intracranial abnormality. 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 Negative for facial fracture 3. Negative for cervical spine fracture Electronically Signed   By: Marlan Palau M.D.   On: 12/01/2017 13:47   Ct Maxillofacial Wo Contrast  Result Date: 12/01/2017 CLINICAL DATA:  Maxillofacial blunt trauma.  Fall EXAM: CT HEAD WITHOUT CONTRAST CT MAXILLOFACIAL WITHOUT CONTRAST CT CERVICAL SPINE WITHOUT CONTRAST TECHNIQUE: Multidetector CT imaging of the head, cervical spine, and maxillofacial structures were performed using the standard protocol without intravenous contrast. Multiplanar CT image reconstructions of the cervical spine and maxillofacial structures were also generated. COMPARISON:  CT head 12/22/2012 FINDINGS: CT HEAD FINDINGS Brain: Ventricle size and cerebral volume normal. Chronic infarct left internal capsule anteriorly. Negative for acute infarct. Negative for acute hemorrhage or mass. No midline shift. Vascular: Negative  for hyperdense vessel Skull: Negative Other: None CT MAXILLOFACIAL FINDINGS Osseous: Negative for facial fracture.  No bone lesion. Orbits: Normal orbital soft tissues. Sinuses: Clear Soft tissues: Laceration and contusion below the chin CT CERVICAL SPINE FINDINGS Alignment: Normal Skull base and vertebrae: Negative for fracture. Soft tissues and spinal canal: Negative Disc  levels: Mild disc degeneration and spurring C4-5. Remaining disc spaces intact. Mild facet degeneration C7-T1 bilaterally. Upper chest: Negative Other: None IMPRESSION: 1. No acute intracranial abnormality. 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 Negative for facial fracture 3. Negative for cervical spine fracture Electronically Signed   By: Marlan Palauharles  Clark M.D.   On: 12/01/2017 13:47    Procedures .Marland Kitchen.Laceration Repair Date/Time: 12/01/2017 4:05 PM Performed by: Eyvonne MechanicHedges, Jami Ohlin, PA-C Authorized by: Eyvonne MechanicHedges, Norinne Jeane, PA-C   Consent:    Consent obtained:  Verbal   Consent given by:  Patient   Risks discussed:  Infection, need for additional repair, nerve damage, poor wound healing, poor cosmetic result and pain   Alternatives discussed:  No treatment and delayed treatment Anesthesia (see MAR for exact dosages):    Anesthesia method:  Local infiltration   Local anesthetic:  Lidocaine 2% w/o epi Laceration details:    Location: Chin.   Length (cm):  2.5 Repair type:    Repair type:  Simple Pre-procedure details:    Preparation:  Patient was prepped and draped in usual sterile fashion Exploration:    Wound extent: no areolar tissue violation noted, no fascia violation noted, no foreign bodies/material noted, no muscle damage noted, no nerve damage noted, no tendon damage noted, no underlying fracture noted and no vascular damage noted     Contaminated: no   Treatment:    Area cleansed with:  Saline   Amount of cleaning:  Standard   Irrigation solution:  Sterile saline   Visualized foreign bodies/material removed: no   Skin repair:    Repair method:  Sutures   Suture size:  5-0   Suture material:  Fast-absorbing gut   Number of sutures:  6 Approximation:    Approximation:  Close Post-procedure details:    Dressing:  Antibiotic ointment   Patient tolerance of procedure:  Tolerated well, no immediate complications   (including critical care time)  Medications  Ordered in ED Medications  lidocaine (XYLOCAINE) 2 % (with pres) injection 400 mg (has no administration in time range)     Initial Impression / Assessment and Plan / ED Course  I have reviewed the triage vital signs and the nursing notes.  Pertinent labs & imaging results that were available during my care of the patient were reviewed by me and considered in my medical decision making (see chart for details).     Patient here with a fall.  CT scans without acute abnormalities, no neurological deficits, well-appearing no acute distress.  Laceration repaired without complication.  Discharged with return precautions.  Patient verbalized understanding and agreement to today's plan   Final Clinical Impressions(s) / ED Diagnoses   Final diagnoses:  Fall, initial encounter  Facial laceration, initial encounter    ED Discharge Orders    None       Rosalio LoudHedges, Jermain Curt, PA-C 12/01/17 1606    Devonte Migues, Tinnie GensJeffrey, PA-C 12/01/17 1609    Benjiman CorePickering, Nathan, MD 12/01/17 2146

## 2017-12-01 NOTE — ED Triage Notes (Signed)
Pt to ER for fall with laceration to chin. No anticoagulation. NAD. Bleeding controlled with gauze.

## 2017-12-01 NOTE — ED Provider Notes (Signed)
  Patient placed in Quick Look pathway, seen and evaluated   Chief Complaint: Fall with chin laceration  HPI:   Patient tripped over a curb and fell onto the pavement, striking her chin.  Injury occurred at approximately 11 AM. She states she was going to see her eye doctor due to a red left eye that started this morning.  No other eye complaints. Denies dizziness, chest pain, shortness of breath, LOC, neuro deficits, nausea/vomiting, neck pain, back pain, or any other complaints.  ROS: Facial trauma with laceration (one)  Physical Exam:   Gen: No distress  Neuro: Awake and Alert  Skin: Warm    Focused Exam:  No diaphoresis.  No pallor.  Head: Proximately 2.5 cm laceration to the patient's chin along with swelling.  Handling oral secretions without difficulty.  Mouth opening intact.  Pulmonary: No increased work of breathing.  Speaks in full sentences without difficulty.  No tachypnea.  Cardiac: Normal rate and regular. Peripheral pulses intact.  Neurologic: A&Ox4.  Cranial nerves III through XII grossly intact.  Grip strength equal bilaterally.  Strength 5/5 in the extremities.  MSK: No peripheral edema.   Head and C-spine imaging ordered due to inability to clear the patient's C-spine based on the presence of distracting injury.   Initiation of care has begun. The patient has been counseled on the process, plan, and necessity for staying for the completion/evaluation, and the remainder of the medical screening examination   Concepcion LivingJoy, Dashun Borre C, PA-C 12/01/17 1326    Benjiman CorePickering, Nathan, MD 12/01/17 2146

## 2017-12-01 NOTE — Discharge Instructions (Addendum)
Please read attached information. If you experience any new or worsening signs or symptoms please return to the emergency room for evaluation. Please follow-up with your primary care provider or specialist as discussed.  °

## 2018-02-23 ENCOUNTER — Emergency Department (HOSPITAL_COMMUNITY): Payer: Medicare Other

## 2018-02-23 ENCOUNTER — Encounter (HOSPITAL_COMMUNITY): Payer: Self-pay | Admitting: Emergency Medicine

## 2018-02-23 ENCOUNTER — Emergency Department (HOSPITAL_COMMUNITY)
Admission: EM | Admit: 2018-02-23 | Discharge: 2018-02-23 | Disposition: A | Discharge status: Home / Self Care | Payer: Medicare Other | Attending: Emergency Medicine | Admitting: Emergency Medicine

## 2018-02-23 DIAGNOSIS — Z79899 Other long term (current) drug therapy: Secondary | ICD-10-CM | POA: Diagnosis not present

## 2018-02-23 DIAGNOSIS — I10 Essential (primary) hypertension: Secondary | ICD-10-CM | POA: Insufficient documentation

## 2018-02-23 DIAGNOSIS — R29898 Other symptoms and signs involving the musculoskeletal system: Secondary | ICD-10-CM

## 2018-02-23 DIAGNOSIS — E119 Type 2 diabetes mellitus without complications: Secondary | ICD-10-CM | POA: Diagnosis not present

## 2018-02-23 DIAGNOSIS — Z7982 Long term (current) use of aspirin: Secondary | ICD-10-CM | POA: Diagnosis not present

## 2018-02-23 NOTE — ED Notes (Signed)
EKG WAS DONE UP FRONT

## 2018-02-23 NOTE — ED Provider Notes (Signed)
[facility]-EMERGENCY DEPT Provider Note   CSN: [id number] Arrival date & time: 02/23/18  1504     History   Chief Complaint Chief Complaint  Patient presents with  . left arm numbness    HPI Paula Moore is a 72 y.o. female who presents with an acute onset of left arm weakness starting at 2:30 PM today.  Past medical history significant for hypertension, hyperlipidemia, prior TIA (per the patient's report).  She states that she was at work and was sitting at her desk when she felt like her arm was weak and felt a squeezing pain around her left forearm.  She denies numbness or tingling.  She does have some neck soreness for the past week which she thinks was due to sleeping funny.  She has never had these symptoms before.  She denies headache, vision changes, left leg weakness, slurred speech, right-sided weakness.  She states that her symptoms are better but she still feels slightly weak on the left side.  HPI  Past Medical History:  Diagnosis Date  . Diabetes mellitus without complication (HCC)    prediabetic  . High cholesterol   . Hypertension   . Shingles     Patient Active Problem List   Diagnosis Date Noted  . Chest pain 12/12/2014  . Hypertensive urgency 12/12/2014  . Hypokalemia 12/12/2014  . High cholesterol   . Diabetes mellitus without complication (HCC)   . Hypertension   . Left-sided thoracic back pain     Past Surgical History:  Procedure Laterality Date  . GUM SURGERY  1978     OB History   None      Home Medications    Prior to Admission medications   Medication Sig Start Date End Date Taking? Authorizing Provider  amLODipine (NORVASC) 10 MG tablet Take 1 tablet (10 mg total) by mouth daily. 12/13/14  Yes Rhetta Mura, MD  aspirin 81 MG chewable tablet Chew 81 mg by mouth daily.   Yes [provider]  Calcium Carbonate-Vitamin D (CALTRATE 600+D PO) Take 1 tablet by mouth daily.   Yes [provider]  Multiple Vitamins-Minerals (ALIVE ONCE DAILY WOMENS 50+) TABS Take 1 tablet by mouth daily.   Yes [provider]  OVER THE COUNTER MEDICATION Take 1 tablet by mouth daily. Vein Circulation   Yes [provider]  pravastatin (PRAVACHOL) 20 MG tablet Take 20 mg by mouth at bedtime.  01/14/18  Yes [provider]  diclofenac (CATAFLAM) 50 MG tablet Take 1 tablet (50 mg total) by mouth 3 (three) times daily. One tablet TID with food prn pain. Patient not taking: Reported on 02/23/2018 09/23/15   Hayden Rasmussen, NP    Family History Family History  Problem Relation Age of Onset  . Heart failure Mother   . Heart failure Father     Social History Social History   Tobacco Use  . Smoking status: Never Smoker  . Smokeless tobacco: Never Used  Substance Use Topics  . Alcohol use: No  . Drug use: No     Allergies   Cozaar [losartan potassium] and Lisinopril   Review of Systems Review of Systems  Eyes: Negative for visual disturbance.  Respiratory: Negative for shortness of breath.   Cardiovascular: Negative for chest pain.  Musculoskeletal: Positive for myalgias and neck pain. Negative for back pain.  Neurological: Positive for weakness. Negative for dizziness, syncope, numbness and headaches.  All other systems reviewed and are negative.    Physical  Exam Updated Vital Signs BP (!) 160/76 (BP Location: Right Arm)   Pulse 70   Temp 98.9 F (37.2 C) (Oral)   Resp 18   Ht 5\' 1"  (1.549 m)   Wt 79.8 kg (176 lb)   SpO2 100%   BMI 33.25 kg/m   Physical Exam  Constitutional: She is oriented to person, place, and time. She appears well-developed and well-nourished. No distress.  HENT:  Head: Normocephalic and atraumatic.  Eyes: Pupils are equal, round, and reactive to light. Conjunctivae are normal. Right eye exhibits no discharge. Left eye exhibits no discharge. No scleral icterus.  Neck: Normal range of motion.  Very mild left cervical paraspinal  muscle tenderness  Cardiovascular: Normal rate.  Pulmonary/Chest: Effort normal. No respiratory distress.  Abdominal: She exhibits no distension.  Neurological: She is alert and oriented to person, place, and time.  5 out of 5 upper extremity strength and equal grip strength.  Normal sensation  Skin: Skin is warm and dry.  Psychiatric: She has a normal mood and affect. Her behavior is normal.  Nursing note and vitals reviewed.    ED Treatments / Results  Labs (all labs ordered are listed, but only abnormal results are displayed) Labs Reviewed - No data to display  EKG None  Radiology Ct Head Wo Contrast  Result Date: 02/23/2018 CLINICAL DATA:  Neck pain. EXAM: CT HEAD WITHOUT CONTRAST CT CERVICAL SPINE WITHOUT CONTRAST TECHNIQUE: Multidetector CT imaging of the head and cervical spine was performed following the standard protocol without intravenous contrast. Multiplanar CT image reconstructions of the cervical spine were also generated. COMPARISON:  CT scan of December 01, 2017. FINDINGS: CT HEAD FINDINGS Brain: Old infarction is seen involving the anterior limb of the left internal capsule. No mass effect or midline shift is noted. Ventricular size is within normal limits. There is no evidence of mass lesion, hemorrhage or acute infarction. Vascular: No hyperdense vessel or unexpected calcification. Skull: Normal. Negative for fracture or focal lesion. Sinuses/Orbits: No acute finding. Other: None. CT CERVICAL SPINE FINDINGS Alignment: Reversal of normal lordosis is noted which most likely is positional in origin. Skull base and vertebrae: No acute fracture. No primary bone lesion or focal pathologic process. Soft tissues and spinal canal: No prevertebral fluid or swelling. No visible canal hematoma. Disc levels: Moderate degenerative disc disease is noted at C4-5 and C5-6. Upper chest: Negative. Other: None. IMPRESSION: No acute intracranial abnormality seen. Moderate multilevel degenerative  disc disease. No acute abnormality seen in the cervical spine. Electronically Signed   By: Lupita RaiderJames  Green Jr, M.D.   On: 02/23/2018 21:16   Ct Cervical Spine Wo Contrast  Result Date: 02/23/2018 CLINICAL DATA:  Neck pain. EXAM: CT HEAD WITHOUT CONTRAST CT CERVICAL SPINE WITHOUT CONTRAST TECHNIQUE: Multidetector CT imaging of the head and cervical spine was performed following the standard protocol without intravenous contrast. Multiplanar CT image reconstructions of the cervical spine were also generated. COMPARISON:  CT scan of December 01, 2017. FINDINGS: CT HEAD FINDINGS Brain: Old infarction is seen involving the anterior limb of the left internal capsule. No mass effect or midline shift is noted. Ventricular size is within normal limits. There is no evidence of mass lesion, hemorrhage or acute infarction. Vascular: No hyperdense vessel or unexpected calcification. Skull: Normal. Negative for fracture or focal lesion. Sinuses/Orbits: No acute finding. Other: None. CT CERVICAL SPINE FINDINGS Alignment: Reversal of normal lordosis is noted which most likely is positional in origin. Skull base and vertebrae: No acute fracture. No primary  bone lesion or focal pathologic process. Soft tissues and spinal canal: No prevertebral fluid or swelling. No visible canal hematoma. Disc levels: Moderate degenerative disc disease is noted at C4-5 and C5-6. Upper chest: Negative. Other: None. IMPRESSION: No acute intracranial abnormality seen. Moderate multilevel degenerative disc disease. No acute abnormality seen in the cervical spine. Electronically Signed   By: Lupita Raider, M.D.   On: 02/23/2018 21:16    Procedures Procedures (including critical care time)  Medications Ordered in ED Medications - No data to display   Initial Impression / Assessment and Plan / ED Course  I have reviewed the triage vital signs and the nursing notes.  Pertinent labs & imaging results that were available during my care of the  patient were reviewed by me and considered in my medical decision making (see chart for details).  72 year old female presents with and is reported left arm weakness starting at 230 today.  The symptoms are isolated to only her left arm.  She has no other focal neurologic symptoms.  Her neurologic exam is normal.  She has no appreciable weakness on exam despite her report.  She is hypertensive at times but otherwise vitals are normal.  We will obtain a CT head and C-spine.  Discussed with Dr. Rubin Payor.  Imaging is remarkable for moderate degenerative disc disease in the neck.  Discussed results with the patient.  She was advised to follow-up with her doctor regarding her symptoms.  She was advised to return if worsening.  Final Clinical Impressions(s) / ED Diagnoses   Final diagnoses:  Left arm weakness    ED Discharge Orders    None       Bethel Born, PA-C 02/24/18 0011    Benjiman Core, MD 02/24/18 [phone]

## 2018-02-23 NOTE — ED Triage Notes (Signed)
Pt reports that today when left arm started getting numb and feels squeezing sensation in left arm. Pt reports that for week had left neck pain and felt like slept on it right. Pt has no neuro deficits in triage at this time.

## 2018-02-23 NOTE — ED Notes (Signed)
Called Pt for vital recheck. Pt's husband said she stepped out to the car. Will try again later.

## 2018-02-23 NOTE — ED Notes (Signed)
Refuses IV.

## 2019-03-29 ENCOUNTER — Ambulatory Visit (AMBULATORY_SURGERY_CENTER): Payer: Self-pay | Admitting: *Deleted

## 2019-03-29 ENCOUNTER — Other Ambulatory Visit: Payer: Self-pay

## 2019-03-29 VITALS — Ht 61.0 in | Wt 169.0 lb

## 2019-03-29 DIAGNOSIS — R195 Other fecal abnormalities: Secondary | ICD-10-CM

## 2019-03-29 NOTE — Progress Notes (Signed)
Patient's pre-visit was done today over the phone with the patient due to COVID-19 pandemic. Name,DOB and address verified. Insurance verified. Packet of Prep instructions mailed to patient including copy of a consent form and pre-procedure patient acknowledgement form-pt is aware. Patient understands to call us back with any questions or concerns.  Patient denies any allergies to eggs or soy. Patient denies any problems with anesthesia/sedation. Patient denies any oxygen use at home. Patient denies taking any diet/weight loss medications or blood thinners. EMMI education assisgned to patient on colonoscopy, this was explained and instructions given to patient. Pt is aware that care partner will wait in the car during procedure; if they feel like they will be too hot to wait in the car; they may wait in the lobby.  We want them to wear a mask (we do not have any that we can provide them), practice social distancing, and we will check their temperatures when they get here.  I did remind patient that their care partner needs to stay in the parking lot the entire time. Pt will wear mask into building. 

## 2019-04-02 ENCOUNTER — Other Ambulatory Visit: Payer: Self-pay | Admitting: Family Medicine

## 2019-04-02 DIAGNOSIS — Z1231 Encounter for screening mammogram for malignant neoplasm of breast: Secondary | ICD-10-CM

## 2019-04-09 ENCOUNTER — Telehealth: Payer: Self-pay | Admitting: Internal Medicine

## 2019-04-09 NOTE — Telephone Encounter (Signed)
Left message for patient regarding Covid-19 screening questions. °Covid-19 Screening Questions: °  °Do you now or have you had a fever in the last 14 days?  °  °Do you have any respiratory symptoms of shortness of breath or cough now or in the last 14 days?  °  °Do you have any family members or close contacts with diagnosed or suspected Covid-19 in the past 14 days?  °  °Have you been tested for Covid-19 and found to be positive?  °  ° °

## 2019-04-12 ENCOUNTER — Encounter: Payer: Self-pay | Admitting: Internal Medicine

## 2019-04-12 ENCOUNTER — Other Ambulatory Visit: Payer: Self-pay

## 2019-04-12 ENCOUNTER — Ambulatory Visit (AMBULATORY_SURGERY_CENTER): Payer: Medicare Other | Admitting: Internal Medicine

## 2019-04-12 VITALS — BP 98/68 | HR 68 | Temp 98.2°F | Resp 13 | Ht 61.0 in | Wt 169.0 lb

## 2019-04-12 DIAGNOSIS — R195 Other fecal abnormalities: Secondary | ICD-10-CM

## 2019-04-12 DIAGNOSIS — K5730 Diverticulosis of large intestine without perforation or abscess without bleeding: Secondary | ICD-10-CM

## 2019-04-12 MED ORDER — SODIUM CHLORIDE 0.9 % IV SOLN: 500.0000 mL | Freq: Once | INTRAVENOUS | Status: AC

## 2019-04-12 NOTE — Patient Instructions (Addendum)
I did not find any problems - the blood in the stool test could have been from inflamed hemorrhoids (when you did the test).  You do have diverticulosis - thickened muscle rings and pouches in the colon wall. Please read the handout about this condition.   You do not need any further stool tests or colonoscopies for prevention - only if you are having problems.  I appreciate the opportunity to care for you. Gatha Mayer, MD, [facility]  Diverticulosis handout given to patient.  Resume previous diet. Continue present medications.  YOU HAD AN ENDOSCOPIC PROCEDURE TODAY AT [facility]:   Refer to the procedure report that was given to you for any specific questions about what was found during the examination.  If the procedure report does not answer your questions, please call your gastroenterologist to clarify.  If you requested that your care partner not be given the details of your procedure findings, then the procedure report has been included in a sealed envelope for you to review at your convenience later.  YOU SHOULD EXPECT: Some feelings of bloating in the abdomen. Passage of more gas than usual.  Walking can help get rid of the air that was put into your GI tract during the procedure and reduce the bloating. If you had a lower endoscopy (such as a colonoscopy or flexible sigmoidoscopy) you may notice spotting of blood in your stool or on the toilet paper. If you underwent a bowel prep for your procedure, you may not have a normal bowel movement for a few days.  Please Note:  You might notice some irritation and congestion in your nose or some drainage.  This is from the oxygen used during your procedure.  There is no need for concern and it should clear up in a day or so.  SYMPTOMS TO REPORT IMMEDIATELY:   Following lower endoscopy (colonoscopy or flexible sigmoidoscopy):  Excessive amounts of blood in the stool  Significant tenderness or worsening of abdominal  pains  Swelling of the abdomen that is new, acute  Fever of 100F or higher  For urgent or emergent issues, a gastroenterologist can be reached at any hour by calling [phone].   DIET:  We do recommend a small meal at first, but then you may proceed to your regular diet.  Drink plenty of fluids but you should avoid alcoholic beverages for 24 hours.  ACTIVITY:  You should plan to take it easy for the rest of today and you should NOT DRIVE or use heavy machinery until tomorrow (because of the sedation medicines used during the test).    FOLLOW UP: Our staff will call the number listed on your records 48-72 hours following your procedure to check on you and address any questions or concerns that you may have regarding the information given to you following your procedure. If we do not reach you, we will leave a message.  We will attempt to reach you two times.  During this call, we will ask if you have developed any symptoms of COVID 19. If you develop any symptoms (ie: fever, flu-like symptoms, shortness of breath, cough etc.) before then, please call [phone].  If you test positive for Covid 19 in the 2 weeks post procedure, please call and report this information to [place].    If any biopsies were taken you will be contacted by phone or by letter within the next 1-3 weeks.  Please call us at [phone] if you have  not heard about the biopsies in 3 weeks.    SIGNATURES/CONFIDENTIALITY: You and/or your care partner have signed paperwork which will be entered into your electronic medical record.  These signatures attest to the fact that that the information above on your After Visit Summary has been reviewed and is understood.  Full responsibility of the confidentiality of this discharge information lies with you and/or your care-partner.

## 2019-04-12 NOTE — Op Note (Signed)
[facility] Patient Name: Paula Moore Procedure Date: 04/12/2019 1:02 PM MRN: [id number] Endoscopist: Iva Booparl E Gessner , MD Age: 73 Referring MD:  Date of Birth: Nov 22, 1945 Gender: Female Account #: [id number] Procedure:                Colonoscopy Indications:              Positive fecal immunochemical test Medicines:                Propofol per Anesthesia, Monitored Anesthesia Care Procedure:                Pre-Anesthesia Assessment:                           - Prior to the procedure, a History and Physical                            was performed, and patient medications and                            allergies were reviewed. The patient's tolerance of                            previous anesthesia was also reviewed. The risks                            and benefits of the procedure and the sedation                            options and risks were discussed with the patient.                            All questions were answered, and informed consent                            was obtained. Prior Anticoagulants: The patient has                            taken no previous anticoagulant or antiplatelet                            agents. ASA Grade Assessment: II - A patient with                            mild systemic disease. After reviewing the risks                            and benefits, the patient was deemed in                            satisfactory condition to undergo the procedure.                           After obtaining informed consent, the colonoscope  was passed under direct vision. Throughout the                            procedure, the patient's blood pressure, pulse, and                            oxygen saturations were monitored continuously. The                            Colonoscope was introduced through the anus and                            advanced to the the cecum, identified by                            appendiceal  orifice and ileocecal valve. The                            colonoscopy was performed without difficulty. The                            patient tolerated the procedure well. The quality                            of the bowel preparation was excellent. The bowel                            preparation used was Miralax via split dose                            instruction. The ileocecal valve, appendiceal                            orifice, and rectum were photographed. Scope In: 1:05:36 PM Scope Out: 1:17:56 PM Scope Withdrawal Time: 0 hours 8 minutes 44 seconds  Total Procedure Duration: 0 hours 12 minutes 20 seconds  Findings:                 The perianal and digital rectal examinations were                            normal.                           A few small-mouthed diverticula were found in the                            sigmoid colon.                           The exam was otherwise without abnormality on                            direct and retroflexion views. Complications:            No immediate complications. Estimated Blood  Loss:     Estimated blood loss: none. Impression:               - Diverticulosis in the sigmoid colon.                           - The examination was otherwise normal on direct                            and retroflexion views.                           - No specimens collected. Recommendation:           - Patient has a contact number available for                            emergencies. The signs and symptoms of potential                            delayed complications were discussed with the                            patient. Return to normal activities tomorrow.                            Written discharge instructions were provided to the                            patient.                           - Resume previous diet.                           - Continue present medications.                           - No repeat colonoscopy or other  routine colon                            cancer screening tests due to current age [age] years                            or older). She does have a FHx rectal cancer but                            brother was in late 5560's at dx so her risk is not                            elevated. Iva Booparl E Gessner, MD 04/12/2019 1:26:53 PM This report has been signed electronically.

## 2019-04-12 NOTE — Progress Notes (Signed)
[facility] June Bullock

## 2019-04-12 NOTE — Progress Notes (Signed)
Pt's states no medical or surgical changes since previsit or office visit. 

## 2019-04-12 NOTE — Progress Notes (Signed)
Patient was tested at another facility and was negative.

## 2019-04-12 NOTE — Progress Notes (Signed)
Report to PACU, RN, vss, BBS= Clear.  

## 2019-04-14 ENCOUNTER — Telehealth: Payer: Self-pay

## 2019-04-14 NOTE — Telephone Encounter (Signed)
  Follow up Call-  Call back number 04/12/2019  Post procedure Call Back phone  # [phone]  Permission to leave phone message Yes  Some recent data might be hidden     Patient questions:  Do you have a fever, pain , or abdominal swelling? No. Pain Score  0 *  Have you tolerated food without any problems? Yes.    Have you been able to return to your normal activities? Yes.    Do you have any questions about your discharge instructions: Diet   No. Medications  No. Follow up visit  No.  Do you have questions or concerns about your Care? No.  Actions: * If pain score is 4 or above: 1. No action needed, pain <4.Have you developed a fever since your procedure? no  2.   Have you had an respiratory symptoms (SOB or cough) since your procedure? no  3.   Have you tested positive for COVID 19 since your procedure no  4.   Have you had any family members/close contacts diagnosed with the COVID 19 since your procedure?  no   If yes to any of these questions please route to Joylene John, RN and Alphonsa Gin, Therapist, sports.

## 2019-05-19 ENCOUNTER — Ambulatory Visit
Admission: RE | Admit: 2019-05-19 | Discharge: 2019-05-19 | Disposition: A | Discharge status: Home / Self Care | Payer: Medicare Other | Source: Ambulatory Visit | Attending: Family Medicine | Admitting: Family Medicine

## 2019-05-19 ENCOUNTER — Other Ambulatory Visit: Payer: Self-pay

## 2019-05-19 DIAGNOSIS — Z1231 Encounter for screening mammogram for malignant neoplasm of breast: Secondary | ICD-10-CM

## 2019-05-21 ENCOUNTER — Other Ambulatory Visit: Payer: Self-pay | Admitting: Family Medicine

## 2019-05-21 DIAGNOSIS — R928 Other abnormal and inconclusive findings on diagnostic imaging of breast: Secondary | ICD-10-CM

## 2019-05-27 ENCOUNTER — Other Ambulatory Visit: Payer: Self-pay

## 2019-05-27 ENCOUNTER — Ambulatory Visit: Payer: Medicare Other

## 2019-05-27 ENCOUNTER — Ambulatory Visit
Admission: RE | Admit: 2019-05-27 | Discharge: 2019-05-27 | Disposition: A | Discharge status: Home / Self Care | Payer: Medicare Other | Source: Ambulatory Visit | Attending: Family Medicine | Admitting: Family Medicine

## 2019-05-27 DIAGNOSIS — R928 Other abnormal and inconclusive findings on diagnostic imaging of breast: Secondary | ICD-10-CM

## 2019-10-01 ENCOUNTER — Ambulatory Visit: Payer: Medicare Other | Attending: Internal Medicine

## 2019-10-01 DIAGNOSIS — Z23 Encounter for immunization: Secondary | ICD-10-CM

## 2019-10-04 NOTE — Progress Notes (Signed)
   Covid-19 Vaccination Clinic  Name:  Paula Moore    MRN: [id number] DOB: 06-28-1946  10/01/2019  Paula Moore was observed post Covid-19 immunization for 15 minutes without incidence. She was provided with Vaccine Information Sheet and instruction to access the V-Safe system.   Paula Moore was instructed to call 911 with any severe reactions post vaccine: Marland Kitchen Difficulty breathing  . Swelling of your face and throat  . A fast heartbeat  . A bad rash all over your body  . Dizziness and weakness    Immunizations Administered    Name Date Dose VIS Date Route   Moderna COVID-19 Vaccine 10/01/2019  1:21 PM 0.5 mL 08/10/2019 Intramuscular   Manufacturer: Moderna   Lot: [id number]   NDC: 08138-871-95

## 2019-10-29 ENCOUNTER — Ambulatory Visit: Payer: Medicare Other

## 2019-11-05 ENCOUNTER — Ambulatory Visit: Payer: Medicare Other | Attending: Internal Medicine

## 2019-11-05 DIAGNOSIS — Z23 Encounter for immunization: Secondary | ICD-10-CM

## 2019-11-05 NOTE — Progress Notes (Signed)
   Covid-19 Vaccination Clinic  Name:  Paula Moore    MRN: [id number] DOB: 04/21/1946  11/05/2019  Paula Moore was observed post Covid-19 immunization for 15 minutes without incidence. She was provided with Vaccine Information Sheet and instruction to access the V-Safe system.   Paula Moore was instructed to call 911 with any severe reactions post vaccine: Marland Kitchen Difficulty breathing  . Swelling of your face and throat  . A fast heartbeat  . A bad rash all over your body  . Dizziness and weakness    Immunizations Administered    Name Date Dose VIS Date Route   Moderna COVID-19 Vaccine 11/05/2019 11:49 AM 0.5 mL 08/10/2019 Intramuscular   Manufacturer: Moderna   Lot: [id number]   NDC: 40768-088-11

## 2020-05-06 IMAGING — MG MM DIGITAL DIAGNOSTIC UNILAT*R* W/ TOMO W/ CAD
4 series · 4 of 12 positions shown · non-contrast
Comparison: Previous exam(s).

CLINICAL DATA: 73-year-old female for further evaluation of
possible RIGHT breast asymmetry on screening mammogram.

EXAM:
DIGITAL DIAGNOSTIC RIGHT MAMMOGRAM WITH TOMO

[R CC synth-2D]
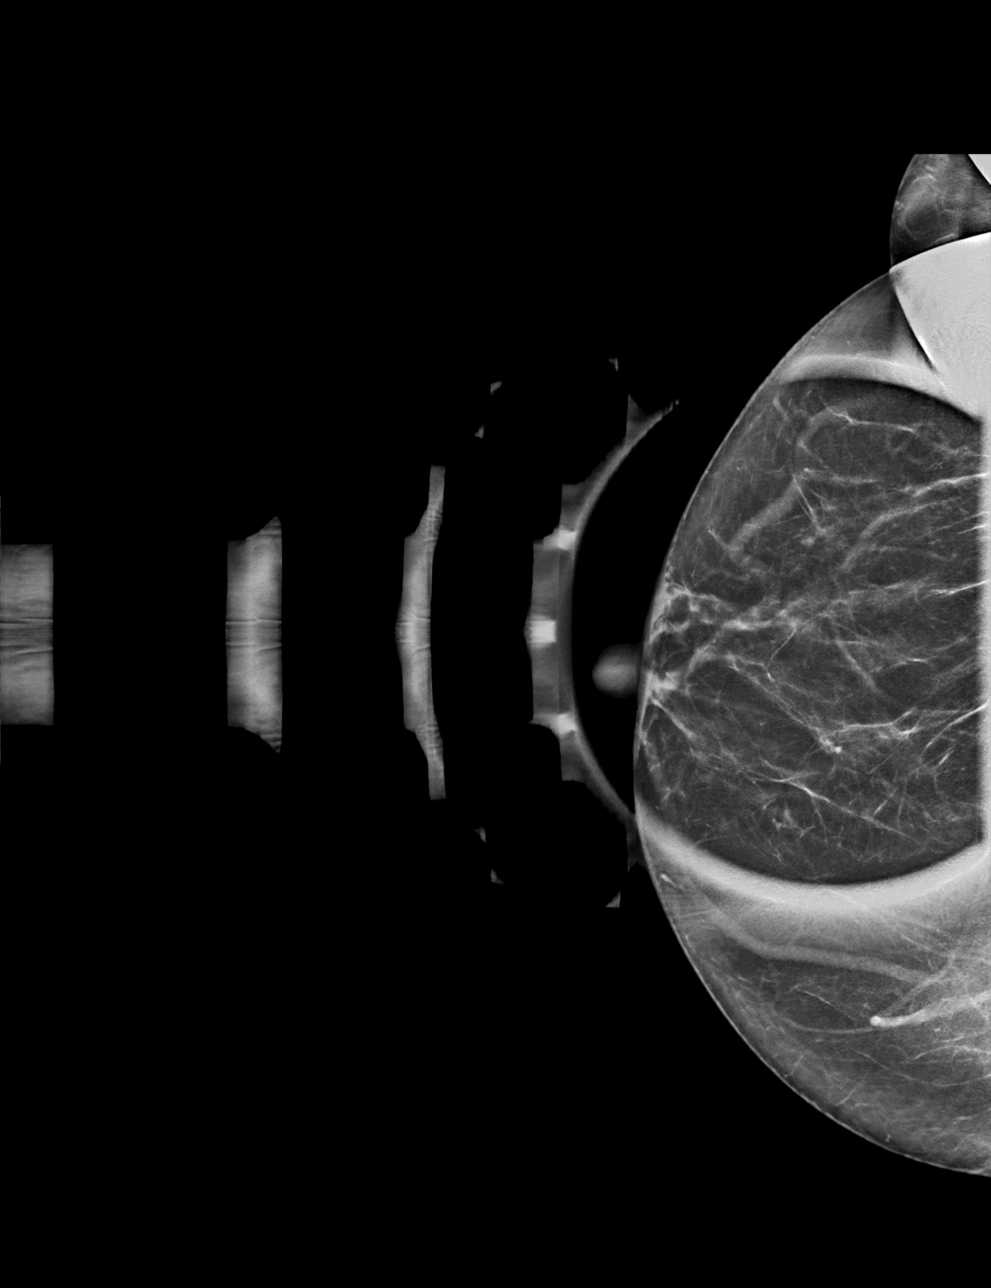

[R MLO synth-2D]
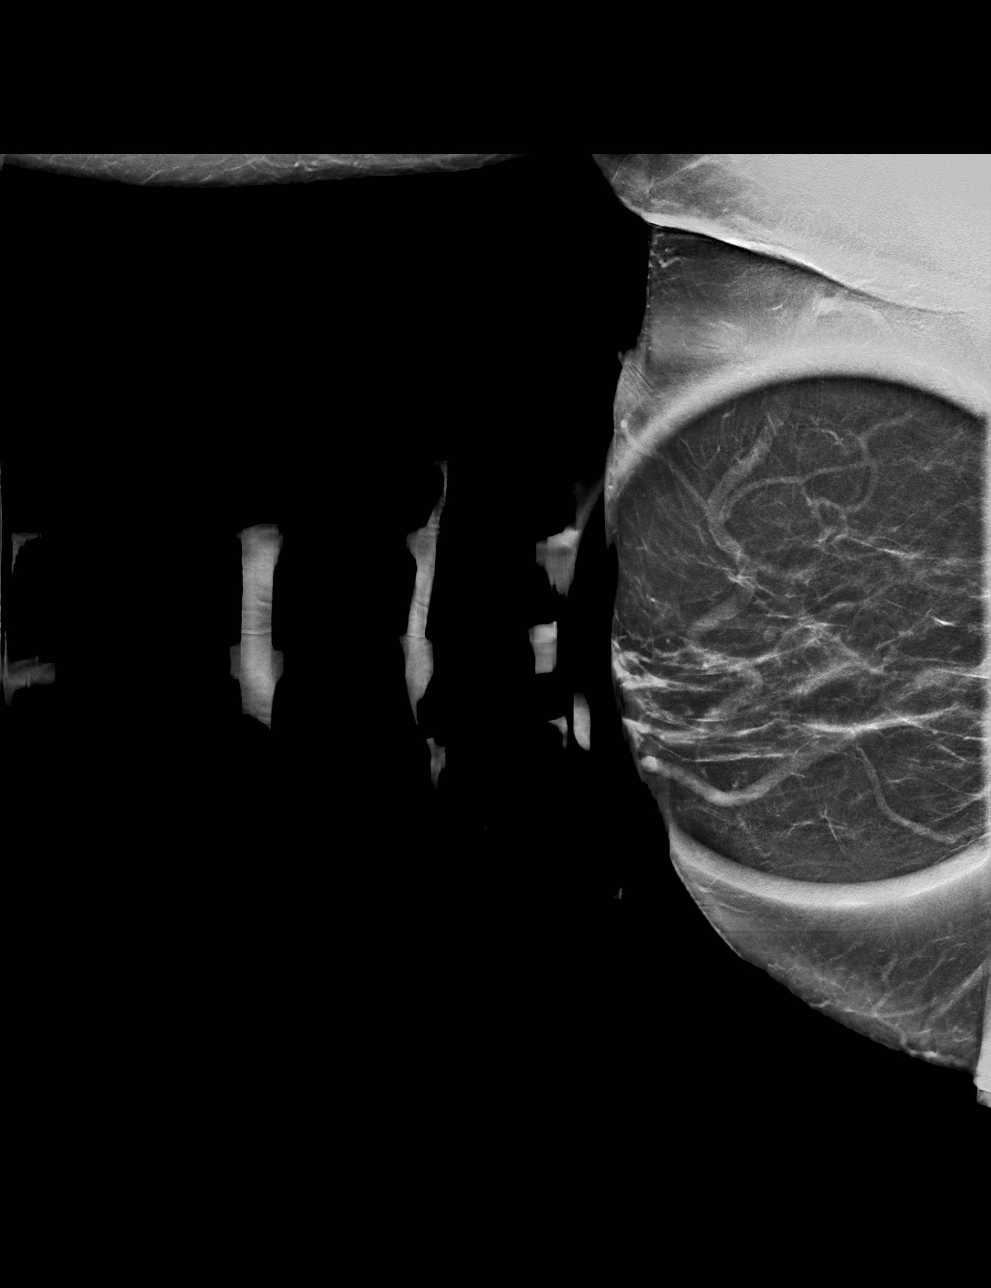

[R CC tomo · tomo slice 31/60.0]
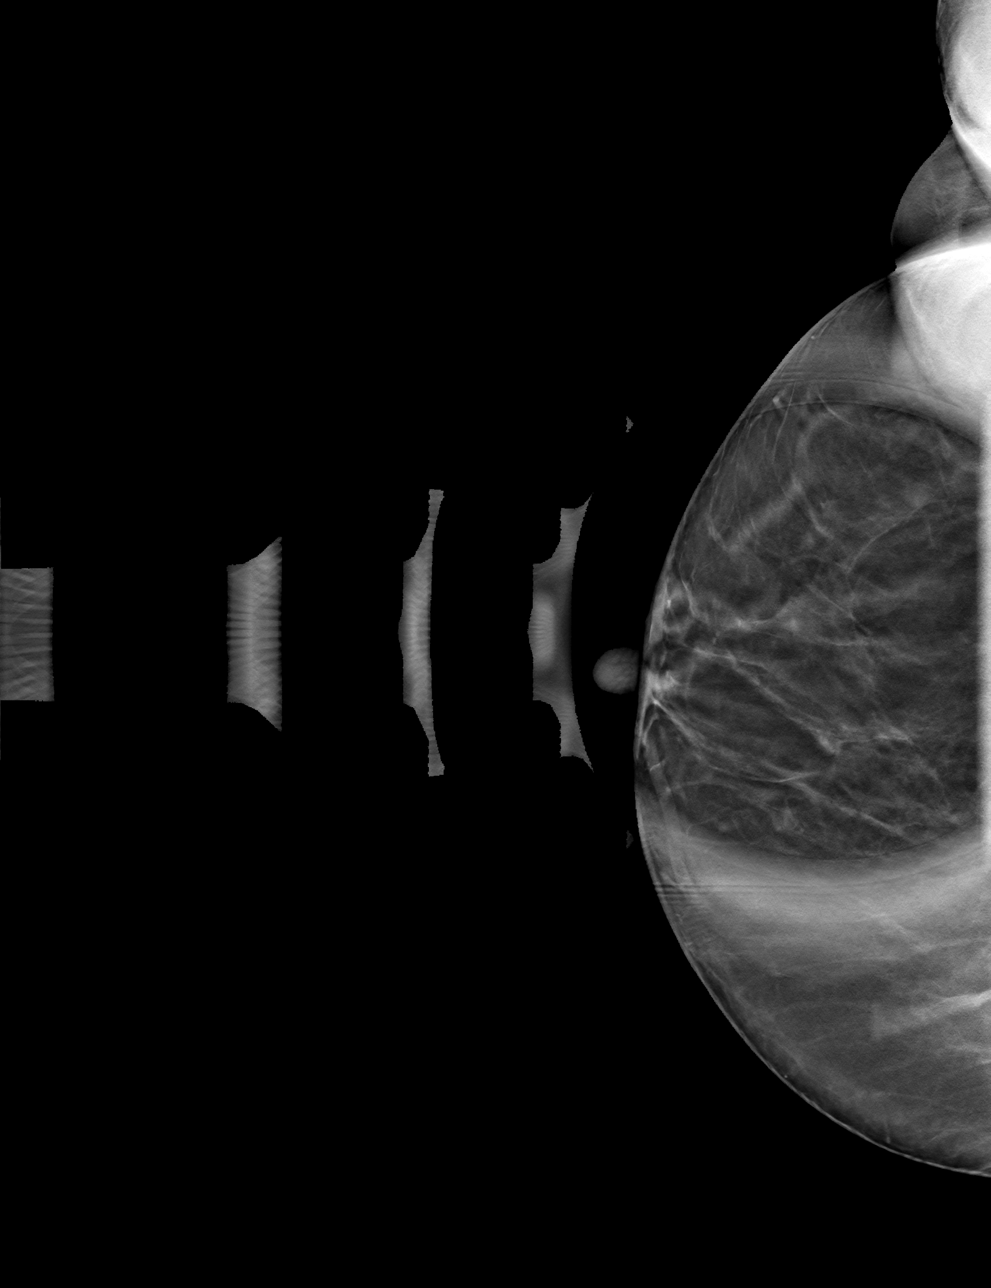

[R MLO tomo · tomo slice 31/60.0]
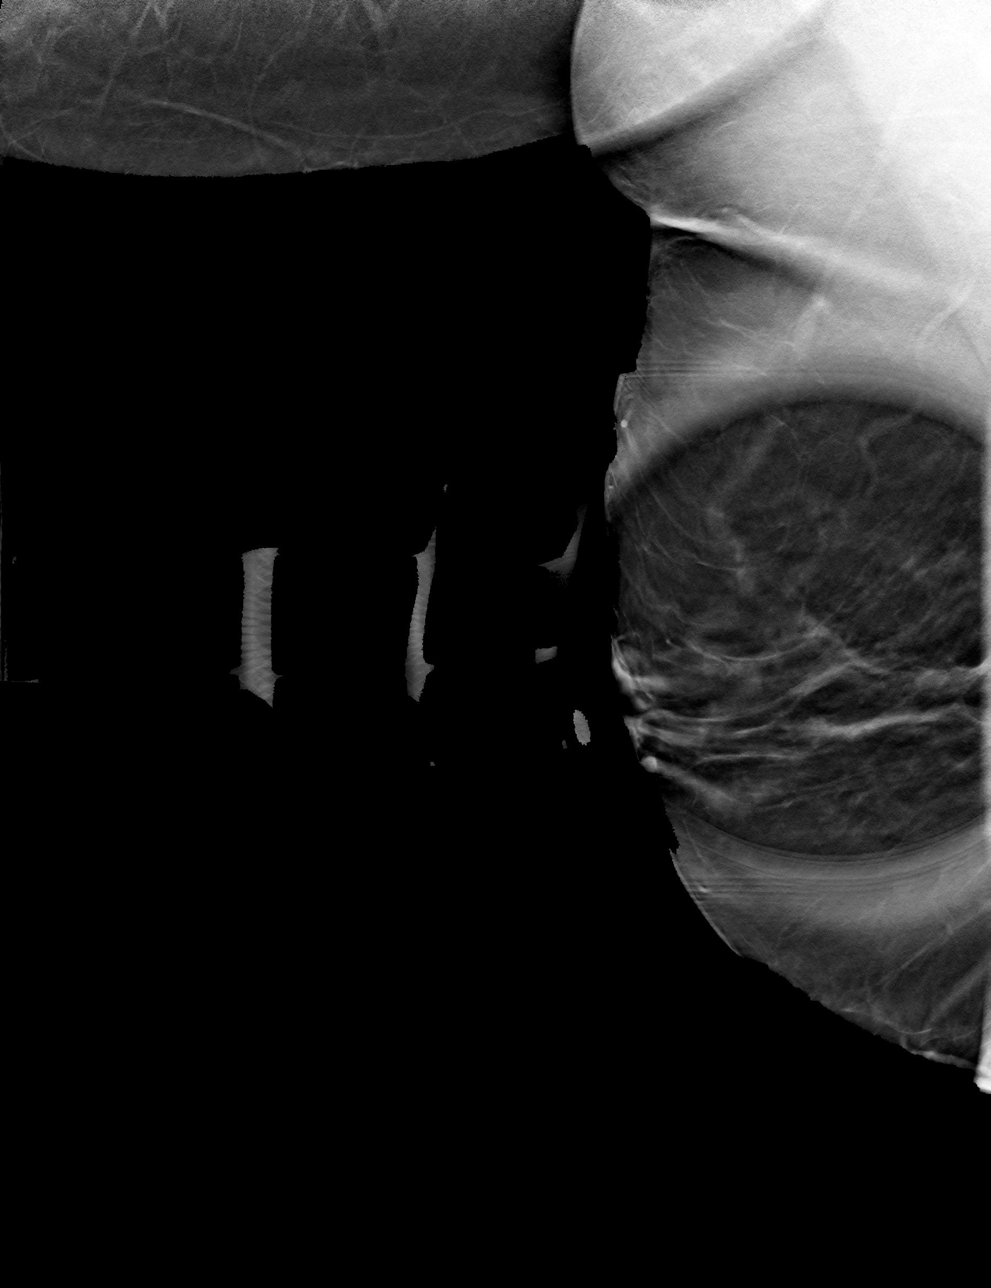

[4 of 12 positions shown; findings below may reference images not displayed]

ACR Breast Density Category b: There are scattered areas of
fibroglandular density.
FINDINGS: 2D/3D spot compression views of the RIGHT breast demonstrate no
persistent abnormality at the site of the screening study finding.
IMPRESSION: No persistent abnormality at the site of the screening study
finding.

RECOMMENDATION:
Bilateral screening mammogram in 1 year.

I have discussed the findings and recommendations with the patient.
If applicable, a reminder letter will be sent to the patient
regarding the next appointment.

BI-RADS CATEGORY  1: Negative.

## 2020-09-11 ENCOUNTER — Other Ambulatory Visit: Payer: Medicare Other

## 2020-09-11 DIAGNOSIS — Z20822 Contact with and (suspected) exposure to covid-19: Secondary | ICD-10-CM

## 2020-09-12 LAB — SARS-COV-2, NAA 2 DAY TAT

## 2020-09-12 LAB — NOVEL CORONAVIRUS, NAA: SARS-CoV-2, NAA: NOT DETECTED

## 2022-11-23 ENCOUNTER — Emergency Department (HOSPITAL_COMMUNITY): Payer: Medicare Other

## 2022-11-23 ENCOUNTER — Other Ambulatory Visit: Payer: Self-pay

## 2022-11-23 ENCOUNTER — Encounter (HOSPITAL_COMMUNITY): Payer: Self-pay | Admitting: *Deleted

## 2022-11-23 ENCOUNTER — Inpatient Hospital Stay (HOSPITAL_COMMUNITY): Payer: Medicare Other

## 2022-11-23 ENCOUNTER — Observation Stay (HOSPITAL_COMMUNITY)
Admission: EM | Admit: 2022-11-23 | Discharge: 2022-11-24 | Disposition: A | Discharge status: Home / Self Care | Payer: Medicare Other | Attending: Internal Medicine | Admitting: Internal Medicine

## 2022-11-23 DIAGNOSIS — R2990 Unspecified symptoms and signs involving the nervous system: Principal | ICD-10-CM

## 2022-11-23 DIAGNOSIS — E119 Type 2 diabetes mellitus without complications: Secondary | ICD-10-CM | POA: Diagnosis not present

## 2022-11-23 DIAGNOSIS — H539 Unspecified visual disturbance: Secondary | ICD-10-CM | POA: Diagnosis not present

## 2022-11-23 DIAGNOSIS — R41841 Cognitive communication deficit: Secondary | ICD-10-CM | POA: Insufficient documentation

## 2022-11-23 DIAGNOSIS — Z6831 Body mass index (BMI) 31.0-31.9, adult: Secondary | ICD-10-CM | POA: Insufficient documentation

## 2022-11-23 DIAGNOSIS — R531 Weakness: Secondary | ICD-10-CM | POA: Diagnosis present

## 2022-11-23 DIAGNOSIS — Z1152 Encounter for screening for COVID-19: Secondary | ICD-10-CM | POA: Diagnosis not present

## 2022-11-23 DIAGNOSIS — E669 Obesity, unspecified: Secondary | ICD-10-CM | POA: Insufficient documentation

## 2022-11-23 DIAGNOSIS — Z79899 Other long term (current) drug therapy: Secondary | ICD-10-CM | POA: Diagnosis not present

## 2022-11-23 DIAGNOSIS — Z7982 Long term (current) use of aspirin: Secondary | ICD-10-CM | POA: Insufficient documentation

## 2022-11-23 DIAGNOSIS — R2689 Other abnormalities of gait and mobility: Secondary | ICD-10-CM | POA: Insufficient documentation

## 2022-11-23 DIAGNOSIS — I639 Cerebral infarction, unspecified: Principal | ICD-10-CM

## 2022-11-23 DIAGNOSIS — I10 Essential (primary) hypertension: Secondary | ICD-10-CM | POA: Insufficient documentation

## 2022-11-23 DIAGNOSIS — R5383 Other fatigue: Secondary | ICD-10-CM | POA: Diagnosis not present

## 2022-11-23 LAB — SEDIMENTATION RATE: Sed Rate: 30 mm/hr — ABNORMAL HIGH (ref 0–22)

## 2022-11-23 LAB — COMPREHENSIVE METABOLIC PANEL
ALT: 15 U/L (ref 0–44)
AST: 21 U/L (ref 15–41)
Albumin: 3.7 g/dL (ref 3.5–5.0)
Alkaline Phosphatase: 72 U/L (ref 38–126)
Anion gap: 7 (ref 5–15)
BUN: 12 mg/dL (ref 8–23)
CO2: 27 mmol/L (ref 22–32)
Calcium: 9.4 mg/dL (ref 8.9–10.3)
Chloride: 105 mmol/L (ref 98–111)
Creatinine, Ser: 1.07 mg/dL — ABNORMAL HIGH (ref 0.44–1.00)
GFR, Estimated: 55 mL/min — ABNORMAL LOW (ref >60)
Glucose, Bld: 105 mg/dL — ABNORMAL HIGH (ref 70–99)
Potassium: 3.6 mmol/L (ref 3.5–5.1)
Sodium: 139 mmol/L (ref 135–145)
Total Bilirubin: 0.5 mg/dL (ref 0.3–1.2)
Total Protein: 7.6 g/dL (ref 6.5–8.1)

## 2022-11-23 LAB — RAPID URINE DRUG SCREEN, HOSP PERFORMED
Amphetamines: NOT DETECTED
Barbiturates: NOT DETECTED
Benzodiazepines: NOT DETECTED
Cocaine: NOT DETECTED
Opiates: NOT DETECTED
Tetrahydrocannabinol: NOT DETECTED

## 2022-11-23 LAB — PROTIME-INR
INR: 1 (ref 0.8–1.2)
Prothrombin Time: 13.4 seconds (ref 11.4–15.2)

## 2022-11-23 LAB — URINALYSIS, ROUTINE W REFLEX MICROSCOPIC
Bilirubin Urine: NEGATIVE
Glucose, UA: NEGATIVE mg/dL
Hgb urine dipstick: NEGATIVE
Ketones, ur: 5 mg/dL — AB
Leukocytes,Ua: NEGATIVE
Nitrite: NEGATIVE
Protein, ur: NEGATIVE mg/dL
Specific Gravity, Urine: 1.013 (ref 1.005–1.030)
pH: 6 (ref 5.0–8.0)

## 2022-11-23 LAB — ETHANOL: Alcohol, Ethyl (B): <10 mg/dL (ref <10)

## 2022-11-23 LAB — CBC WITH DIFFERENTIAL/PLATELET
Abs Immature Granulocytes: 0.02 10*3/uL (ref 0.00–0.07)
Basophils Absolute: 0 10*3/uL (ref 0.0–0.1)
Basophils Relative: 0 %
Eosinophils Absolute: 0.1 10*3/uL (ref 0.0–0.5)
Eosinophils Relative: 1 %
HCT: 43.4 % (ref 36.0–46.0)
Hemoglobin: 14.1 g/dL (ref 12.0–15.0)
Immature Granulocytes: 0 %
Lymphocytes Relative: 22 %
Lymphs Abs: 1.2 10*3/uL (ref 0.7–4.0)
MCH: 28.4 pg (ref 26.0–34.0)
MCHC: 32.5 g/dL (ref 30.0–36.0)
MCV: 87.3 fL (ref 80.0–100.0)
Monocytes Absolute: 0.5 10*3/uL (ref 0.1–1.0)
Monocytes Relative: 9 %
Neutro Abs: 3.7 10*3/uL (ref 1.7–7.7)
Neutrophils Relative %: 68 %
Platelets: 297 10*3/uL (ref 150–400)
RBC: 4.97 MIL/uL (ref 3.87–5.11)
RDW: 14.2 % (ref 11.5–15.5)
WBC: 5.4 10*3/uL (ref 4.0–10.5)
nRBC: 0 % (ref 0.0–0.2)

## 2022-11-23 LAB — C-REACTIVE PROTEIN: CRP: 0.7 mg/dL (ref <1.0)

## 2022-11-23 LAB — RESP PANEL BY RT-PCR (RSV, FLU A&B, COVID)  RVPGX2
Influenza A by PCR: NEGATIVE
Influenza B by PCR: NEGATIVE
Resp Syncytial Virus by PCR: NEGATIVE
SARS Coronavirus 2 by RT PCR: NEGATIVE

## 2022-11-23 LAB — APTT: aPTT: 30 seconds (ref 24–36)

## 2022-11-23 MED ORDER — ACETAMINOPHEN 160 MG/5ML PO SOLN: 650.0000 mg | ORAL | Status: DC | PRN

## 2022-11-23 MED ORDER — IOHEXOL 350 MG/ML SOLN
75.0000 mL | Freq: Once | INTRAVENOUS | Status: AC | PRN
  Administered 2022-11-23: 75 mL via INTRAVENOUS

## 2022-11-23 MED ORDER — ACETAMINOPHEN 650 MG RE SUPP: 650.0000 mg | RECTAL | Status: DC | PRN

## 2022-11-23 MED ORDER — SENNOSIDES-DOCUSATE SODIUM 8.6-50 MG PO TABS: 1.0000 | ORAL_TABLET | Freq: Every evening | ORAL | Status: DC | PRN

## 2022-11-23 MED ORDER — SODIUM CHLORIDE 0.9 % IV SOLN: INTRAVENOUS | Status: DC

## 2022-11-23 MED ORDER — ACETAMINOPHEN 325 MG PO TABS: 650.0000 mg | ORAL_TABLET | ORAL | Status: DC | PRN

## 2022-11-23 MED ORDER — STROKE: EARLY STAGES OF RECOVERY BOOK
Freq: Once | Status: DC
  Filled 2022-11-23: qty 1

## 2022-11-23 MED ORDER — HEPARIN SODIUM (PORCINE) 5000 UNIT/ML IJ SOLN
5000.0000 [IU] | Freq: Three times a day (TID) | INTRAMUSCULAR | Status: DC
  Administered 2022-11-23 – 2022-11-24 (×2): 5000 [IU] via SUBCUTANEOUS
  Filled 2022-11-23 (×2): qty 1

## 2022-11-23 NOTE — H&P (Addendum)
History and Physical    Paula Moore [id number] DOB: 01-20-1946 DOA: 11/23/2022  PCP: Katherina Mires, MD  Patient coming from: home  I have personally briefly reviewed patient's old medical records in [place]  Chief Complaint: LUE weakness, vision change x 30 min  HPI: Paula Moore is a 77 y.o. female with medical history significant of  DMII, HLD, HTN  who presents to ED by POV after having episode of Left upper extremity weakness and vision loss described as b/l blurry vision then darkening of vision. Per patient episode started at around 4:15 day of presentation and lasted for around 30 mins. Patient states currently all symptoms have resolved. She notes no associated HA, N/v, palpitations, presyncope prior to this. She notes she felt as if she was getting a cold had mild chills and nasal congestion for which she took cold medications the am prior to onset of symptoms.  Currently she states she notes she feels her left upper extremity does feel weaker than her baseline. On further ros no sob/chest/pain /fever/dysuria / n/v/diarrhea or abdominal pain.  ED Course:  Afeb, BNP 127/67,  hr 69 , rr 19 sat 100%  EKG: nsr LAE Wbc: 5.4, hgb 14.1, plt 297,  NA 139, K 3.6, glu 105 cr 1.07( 0.8) Respiratory panel :neg CTH: IMPRESSION: 1. No acute intracranial abnormality. 2. Generalized cerebral atrophy with widening of the extra-axial spaces and ventricular dilatation. 3. Small, chronic left basal ganglia lacunar infarct.  MRI brain IMPRESSION: 1. Small focus of acute ischemia at the right temporal/occipital junction. No hemorrhage or mass effect. 2. Old left basal ganglia small vessel infarct and findings of chronic microvascular ischemia. Review of Systems: As per HPI otherwise 10 point review of systems negative.   Past Medical History:  Diagnosis Date   Diabetes mellitus without complication ([place])    prediabetic   High cholesterol    Hypertension    Shingles      Past Surgical History:  Procedure Laterality Date   GUM SURGERY  1978     reports that she has never smoked. She has never used smokeless tobacco. She reports that she does not drink alcohol and does not use drugs.  Allergies  Allergen Reactions   Cozaar [Losartan Potassium]     Disorientation    Lisinopril     coughing    Family History  Problem Relation Age of Onset   Heart failure Mother    Heart failure Father    Cancer Sister    Rectal cancer Brother        late 27's   Colon polyps Daughter    Colon cancer Neg Hx    Esophageal cancer Neg Hx    Stomach cancer Neg Hx     Prior to Admission medications   Medication Sig Start Date End Date Taking? Authorizing Provider  amLODipine (NORVASC) 10 MG tablet Take 1 tablet (10 mg total) by mouth daily. 12/13/14   Nita Sells, MD  aspirin 81 MG chewable tablet Chew 81 mg by mouth daily.    [provider]  Calcium Carbonate-Vitamin D (CALTRATE 600+D PO) Take 1 tablet by mouth daily.    [provider]  Multiple Vitamins-Minerals (ALIVE ONCE DAILY WOMENS 50+) TABS Take 1 tablet by mouth daily.    [provider]  Multiple Vitamins-Minerals (HAIR SKIN NAILS PO) Take by mouth.    [provider]  OVER THE COUNTER MEDICATION Take 1 tablet by mouth daily. Vein Circulation supplement  [provider]  pravastatin (PRAVACHOL) 20 MG tablet Take 20 mg by mouth at bedtime.  01/14/18   [provider]  VITAMIN K PO Take by mouth. With m7    [provider]    Physical Exam: Vitals:   11/23/22 1828 11/23/22 1830 11/23/22 1845 11/23/22 1900  BP: 128/66 123/65 (!) 100/58 120/61  Pulse: 62 (!) 59 60 (!) 55  Resp: 19 17 18 18   Temp:      TempSrc:      SpO2: 100% 100% 99% 100%  Weight:      Height:        Constitutional: NAD, calm, comfortable Vitals:   11/23/22 1828 11/23/22 1830 11/23/22 1845 11/23/22 1900  BP: 128/66 123/65 (!) 100/58 120/61  Pulse: 62  (!) 59 60 (!) 55  Resp: 19 17 18 18   Temp:      TempSrc:      SpO2: 100% 100% 99% 100%  Weight:      Height:       Eyes: PERRL, lids and conjunctivae normal ENMT: Mucous membranes are moist. Posterior pharynx clear of any exudate or lesions.Normal dentition.  Neck: normal, supple, no masses, no thyromegaly Respiratory: clear to auscultation bilaterally, no wheezing, no crackles. Normal respiratory effort. No accessory muscle use.  Cardiovascular: Regular rate and rhythm, no murmurs / rubs / gallops. No extremity edema. 2+ pedal pulses.  Abdomen: no tenderness, no masses palpated. No hepatosplenomegaly. Bowel sounds positive.  Musculoskeletal: no clubbing / cyanosis. No joint deformity upper and lower extremities. Good ROM, no contractures. Normal muscle tone.  Skin: no rashes, lesions, ulcers. No induration Neurologic: CN 2-12 grossly intact. Sensation intact,  Strength 5/5 in all 4.  Psychiatric: Normal judgment and insight. Alert and oriented x 3. Normal mood.    Labs on Admission: I have personally reviewed following labs and imaging studies  CBC: Recent Labs  Lab 11/23/22 1754  WBC 5.4  NEUTROABS 3.7  HGB 14.1  HCT 43.4  MCV 87.3  PLT 123XX123   Basic Metabolic Panel: Recent Labs  Lab 11/23/22 1754  NA 139  K 3.6  CL 105  CO2 27  GLUCOSE 105*  BUN 12  CREATININE 1.07*  CALCIUM 9.4   GFR: Estimated Creatinine Clearance: 43.3 mL/min (A) (by C-G formula based on SCr of 1.07 mg/dL (H)). Liver Function Tests: Recent Labs  Lab 11/23/22 1754  AST 21  ALT 15  ALKPHOS 72  BILITOT 0.5  PROT 7.6  ALBUMIN 3.7   No results for input(s): "LIPASE", "AMYLASE" in the last 168 hours. No results for input(s): "AMMONIA" in the last 168 hours. Coagulation Profile: No results for input(s): "INR", "PROTIME" in the last 168 hours. Cardiac Enzymes: No results for input(s): "CKTOTAL", "CKMB", "CKMBINDEX", "TROPONINI" in the last 168 hours. BNP (last 3 results) No results for  input(s): "PROBNP" in the last 8760 hours. HbA1C: No results for input(s): "HGBA1C" in the last 72 hours. CBG: No results for input(s): "GLUCAP" in the last 168 hours. Lipid Profile: No results for input(s): "CHOL", "HDL", "LDLCALC", "TRIG", "CHOLHDL", "LDLDIRECT" in the last 72 hours. Thyroid Function Tests: No results for input(s): "TSH", "T4TOTAL", "FREET4", "T3FREE", "THYROIDAB" in the last 72 hours. Anemia Panel: No results for input(s): "VITAMINB12", "FOLATE", "FERRITIN", "TIBC", "IRON", "RETICCTPCT" in the last 72 hours. Urine analysis:    Component Value Date/Time   COLORURINE YELLOW 12/27/2009 0329   APPEARANCEUR CLEAR 12/27/2009 0329   LABSPEC 1.021 12/27/2009 0329   PHURINE 6.5 12/27/2009 0329  GLUCOSEU NEGATIVE 12/27/2009 0329   HGBUR NEGATIVE 12/27/2009 0329   BILIRUBINUR NEGATIVE 12/27/2009 0329   KETONESUR NEGATIVE 12/27/2009 0329   PROTEINUR NEGATIVE 12/27/2009 0329   UROBILINOGEN 0.2 12/27/2009 0329   NITRITE NEGATIVE 12/27/2009 0329   LEUKOCYTESUR MODERATE (A) 12/27/2009 0329    Radiological Exams on Admission: CT Head Wo Contrast  Result Date: 11/23/2022 CLINICAL DATA:  Transient ischemic attack. EXAM: CT HEAD WITHOUT CONTRAST TECHNIQUE: Contiguous axial images were obtained from the base of the skull through the vertex without intravenous contrast. RADIATION DOSE REDUCTION: This exam was performed according to the departmental dose-optimization program which includes automated exposure control, adjustment of the mA and/or kV according to patient size and/or use of iterative reconstruction technique. COMPARISON:  December 22, 2012 FINDINGS: Brain: There is mild cerebral atrophy with widening of the extra-axial spaces and ventricular dilatation. There are areas of decreased attenuation within the white matter tracts of the supratentorial brain, consistent with microvascular disease changes. A small, chronic left basal ganglia lacunar infarct is noted. Vascular: There is  marked severity bilateral cavernous carotid artery calcification. Skull: Normal. Negative for fracture or focal lesion. Sinuses/Orbits: No acute finding. Other: None. IMPRESSION: 1. No acute intracranial abnormality. 2. Generalized cerebral atrophy with widening of the extra-axial spaces and ventricular dilatation. 3. Small, chronic left basal ganglia lacunar infarct. Electronically Signed   By: Aram Candela M.D.   On: 11/23/2022 18:52    EKG: Independently reviewed. See above  Assessment/Plan  Acute Right temporal/occipital CVA -pt p/w LUE weakness and vision change now resolved  -admit NIHSS  of 0 -CTH NAD -CTA pending  -admit CVA protocol -A1c/HLD pending -Permissive HTN x48 hours from sx onset  -goal BP < 220/110, PRN above these parameters -plavix load 300 mg x 1 - ASA 81mg  daily + plavix 75mg  daily x21 days f/b ASA 81mg  daily monotherapy after that -neuro checks , SLP, PT/OT  -await final neuro recs     Uri sxs -check respiratory panel  Mild dehydration  -cr base 0.8 now 1.07 -gently ivfs -f/u on labs   DMII -iss/fs -A!C 6.2 -12/13/14  HLD -continue  pravastatin to 80mg   daily    HTN  - hold amlodipine/permissive hypertension -patient current normotensive   DVT prophylaxis: heparin Code Status: full/ as discussed per patient wishes in event of cardiac arrest  Family Communication: none at bedside Disposition Plan: patient  expected to be admitted greater than 2 midnights  Consults called: Neuro Dr Amada Jupiter  Admission status: progressive care   Lurline Del MD [facility]   If 7PM-7AM, please contact night-coverage [email or web address] Password [facility]  11/23/2022, 8:26 PM

## 2022-11-23 NOTE — ED Notes (Signed)
Patient transported to MRI 

## 2022-11-23 NOTE — ED Triage Notes (Signed)
The pt  has had a cold since last night  she experienced dizziness and weakness and her vision was dark  she took a baby aspirin her vision was dark then lightned  she  she has some lt arm weakness and  she thinks it getting better  her vision is clear now`

## 2022-11-23 NOTE — ED Notes (Signed)
Patient returned from CT

## 2022-11-23 NOTE — ED Notes (Signed)
Patient returned to room from MRI

## 2022-11-23 NOTE — ED Notes (Signed)
Patient transported to CT 

## 2022-11-23 NOTE — ED Provider Triage Note (Signed)
Emergency Medicine Provider Triage Evaluation Note  Paula Moore , a 77 y.o. female  was evaluated in triage.  Pt complains of left upper extremity weakness, vision changes.  States that her symptoms began at approximately 4:15 PM and lasted a short time before resolving spontaneously.  States the left upper extremity weakness felt more like fatigue.  States she also has generalized fatigue.  Currently denies any vision changes  Review of Systems  Positive: As above Negative: As above  Physical Exam  BP 127/67 (BP Location: Left Arm)   Pulse 69   Temp 98.7 F (37.1 C) (Oral)   Resp 19   Ht 5\' 1"  (1.549 m)   Wt 76.7 kg   SpO2 100%   BMI 31.95 kg/m  Gen:   Awake, no distress   Resp:  Normal effort  MSK:   Moves extremities without difficulty  Other:  No facial asymmetry, slurred speech, unilateral or global weakness, pronator drift.  Normal finger-to-nose, heel-to-shin and shoulder shrug.  Sensation intact in all extremities  Medical Decision Making  Medically screening exam initiated at 5:53 PM.  Appropriate orders placed.  Paula Moore was informed that the remainder of the evaluation will be completed by another provider, this initial triage assessment does not replace that evaluation, and the importance of remaining in the ED until their evaluation is complete.  LVO negative, will be next patient back   Roylene Reason, Hershal Coria 11/23/22 1754

## 2022-11-23 NOTE — ED Provider Notes (Signed)
[place] EMERGENCY DEPARTMENT AT [facility] Provider Note   CSN: [id number] Arrival date & time: 11/23/22  1714     History  Chief Complaint  Patient presents with   dizziness vision problems    Melike Klomp is a 77 y.o. female.  History of diabetes, hypertension, hypercholesterolemia, self-reported TIA who presents to the ED for evaluation of left upper extremity weakness and vision changes.  She states this occurred while she was standing in the grocery store at approximately 4:15 PM tonight.  It lasted approximately 30 minutes and then resolved spontaneously.  She states her left upper extremity still feels slightly fatigued but also reports generalized fatigue as well.  States she woke up with an upper respiratory infection this morning.  She states that her vision started to go dark, but she took a baby aspirin and her symptoms improved.  Currently states that she has normal vision.  Denies headaches, fevers, chills.  States he had a similar episode in 2016, however reports full vision loss at that time.  She is not anticoagulated but does take a baby aspirin daily.  HPI     Home Medications Prior to Admission medications   Medication Sig Start Date End Date Taking? Authorizing Provider  amLODipine (NORVASC) 10 MG tablet Take 1 tablet (10 mg total) by mouth daily. 12/13/14   Rhetta Mura, MD  aspirin 81 MG chewable tablet Chew 81 mg by mouth daily.    [provider]  Calcium Carbonate-Vitamin D (CALTRATE 600+D PO) Take 1 tablet by mouth daily.    [provider]  Multiple Vitamins-Minerals (ALIVE ONCE DAILY WOMENS 50+) TABS Take 1 tablet by mouth daily.    [provider]  Multiple Vitamins-Minerals (HAIR SKIN NAILS PO) Take by mouth.    [provider]  OVER THE COUNTER MEDICATION Take 1 tablet by mouth daily. Vein Circulation supplement    [provider]  pravastatin (PRAVACHOL) 20 MG tablet Take 20 mg by mouth at  bedtime.  01/14/18   [provider]  VITAMIN K PO Take by mouth. With m7    [provider]      Allergies    Cozaar [losartan potassium] and Lisinopril    Review of Systems   Review of Systems  Constitutional:  Positive for fatigue.  Eyes:  Positive for visual disturbance.  All other systems reviewed and are negative.   Physical Exam Updated Vital Signs BP (!) 159/66   Pulse (!) 58   Temp 98.7 F (37.1 C) (Oral)   Resp 18   Ht 5\' 1"  (1.549 m)   Wt 76.7 kg   SpO2 100%   BMI 31.95 kg/m  Physical Exam Vitals and nursing note reviewed.  Constitutional:      General: She is not in acute distress.    Appearance: Normal appearance. She is well-developed. She is not ill-appearing, toxic-appearing or diaphoretic.     Comments: Resting comfortably in bed.  No acute distress.  Ambulatory in the ED without difficulty  HENT:     Head: Normocephalic and atraumatic.  Eyes:     Conjunctiva/sclera: Conjunctivae normal.  Cardiovascular:     Rate and Rhythm: Normal rate and regular rhythm.     Heart sounds: No murmur heard. Pulmonary:     Effort: Pulmonary effort is normal. No respiratory distress.     Breath sounds: Normal breath sounds.  Abdominal:     Palpations: Abdomen is soft.     Tenderness: There is no abdominal  tenderness.  Musculoskeletal:        General: No swelling.     Cervical back: Neck supple.  Skin:    General: Skin is warm and dry.     Capillary Refill: Capillary refill takes less than 2 seconds.  Neurological:     General: No focal deficit present.     Mental Status: She is alert and oriented to person, place, and time.     Comments: No facial asymmetry, slurred speech, unilateral or global weakness, pronator drift.  Normal finger-to-nose, heel-to-shin and shoulder shrug.  Sensation intact in all extremities  Psychiatric:        Mood and Affect: Mood normal.     ED Results / Procedures / Treatments   Labs (all labs ordered are listed,  but only abnormal results are displayed) Labs Reviewed  COMPREHENSIVE METABOLIC PANEL - Abnormal; Notable for the following components:      Result Value   Glucose, Bld 105 (*)    Creatinine, Ser 1.07 (*)    GFR, Estimated 55 (*)    All other components within normal limits  RESP PANEL BY RT-PCR (RSV, FLU A&B, COVID)  [id number]  CBC WITH DIFFERENTIAL/PLATELET  PROTIME-INR  APTT  ETHANOL  RAPID URINE DRUG SCREEN, HOSP PERFORMED  URINALYSIS, ROUTINE W REFLEX MICROSCOPIC  SEDIMENTATION RATE  C-REACTIVE PROTEIN  LIPID PANEL  HEMOGLOBIN A1C  CBC  CREATININE, SERUM    EKG EKG Interpretation  Date/Time:  Saturday November 23 2022 17:29:28 EDT Ventricular Rate:  65 PR Interval:  156 QRS Duration: 74 QT Interval:  414 QTC Calculation: 430 R Axis:   16 Text Interpretation: Normal sinus rhythm Possible Left atrial enlargement Confirmed by Gloris Manchester [id number]) on 11/23/2022 7:20:31 PM  Radiology MR BRAIN WO CONTRAST  Result Date: 11/23/2022 CLINICAL DATA:  Transient ischemic attack.  Vision changes. EXAM: MRI HEAD WITHOUT CONTRAST TECHNIQUE: Multiplanar, multiecho pulse sequences of the brain and surrounding structures were obtained without intravenous contrast. COMPARISON:  12/27/2009 FINDINGS: Brain: Small focus of acute ischemia at the right temporal/occipital junction. No acute hemorrhage. No chronic microhemorrhage or siderosis. There is multifocal hyperintense T2-weighted signal within the white matter. Generalized volume loss. Old left basal ganglia small vessel infarct. The midline structures are normal. Vascular: Normal flow voids. Skull and upper cervical spine: Normal marrow signal. Sinuses/Orbits: Small amount of left mastoid fluid.  Normal orbits. Other: None IMPRESSION: 1. Small focus of acute ischemia at the right temporal/occipital junction. No hemorrhage or mass effect. 2. Old left basal ganglia small vessel infarct and findings of chronic microvascular ischemia. Electronically  Signed   By: Deatra Robinson M.D.   On: 11/23/2022 20:25   CT Head Wo Contrast  Result Date: 11/23/2022 CLINICAL DATA:  Transient ischemic attack. EXAM: CT HEAD WITHOUT CONTRAST TECHNIQUE: Contiguous axial images were obtained from the base of the skull through the vertex without intravenous contrast. RADIATION DOSE REDUCTION: This exam was performed according to the departmental dose-optimization program which includes automated exposure control, adjustment of the mA and/or kV according to patient size and/or use of iterative reconstruction technique. COMPARISON:  December 22, 2012 FINDINGS: Brain: There is mild cerebral atrophy with widening of the extra-axial spaces and ventricular dilatation. There are areas of decreased attenuation within the white matter tracts of the supratentorial brain, consistent with microvascular disease changes. A small, chronic left basal ganglia lacunar infarct is noted. Vascular: There is marked severity bilateral cavernous carotid artery calcification. Skull: Normal. Negative for fracture or focal lesion. Sinuses/Orbits: No acute finding.  Other: None. IMPRESSION: 1. No acute intracranial abnormality. 2. Generalized cerebral atrophy with widening of the extra-axial spaces and ventricular dilatation. 3. Small, chronic left basal ganglia lacunar infarct. Electronically Signed   By: Aram Candela M.D.   On: 11/23/2022 18:52    Procedures Procedures    Medications Ordered in ED Medications   stroke: early stages of recovery book (has no administration in time range)  0.9 %  sodium chloride infusion (has no administration in time range)  acetaminophen (TYLENOL) tablet 650 mg (has no administration in time range)    Or  acetaminophen (TYLENOL) 160 MG/5ML solution 650 mg (has no administration in time range)    Or  acetaminophen (TYLENOL) suppository 650 mg (has no administration in time range)  senna-docusate (Senokot-S) tablet 1 tablet (has no administration in time  range)  heparin injection 5,000 Units (has no administration in time range)    ED Course/ Medical Decision Making/ A&P Clinical Course as of 11/23/22 2050  Sat Nov 23, 2022  1945 Spoke with neurology Dr. Amada Jupiter.  Recommends admission to hospitalist after MRI results for stroke workup [AS]  2026 Spoke with hospitalist Dr. Maisie Fus.  Will admit [AS]    Clinical Course User Index [AS] Kasia Trego, Edsel Petrin, PA-C                             Medical Decision Making Amount and/or Complexity of Data Reviewed Labs: ordered. Radiology: ordered.  Risk Decision regarding hospitalization.  This patient presents to the ED for concern of left upper extremity weakness and vision change, this involves an extensive number of treatment options, and is a complaint that carries with it a high risk of complications and morbidity.  The differential diagnosis includes The differential diagnosis of weakness includes but is not limited to neurologic causes (GBS, myasthenia gravis, CVA, MS, ALS, transverse myelitis, spinal cord injury, CVA, botulism, ) and other causes: ACS, Arrhythmia, syncope, orthostatic hypotension, sepsis, hypoglycemia, electrolyte disturbance, hypothyroidism, respiratory failure, symptomatic anemia, dehydration, heat injury, polypharmacy, malignancy.   Co morbidities that complicate the patient evaluation  diabetes, hypertension, hypercholesterolemia, self-reported TIA  My initial workup includes basic labs, CT head  Additional history obtained from: Nursing notes from this visit. Previous records within EMR system ED visit for similar on 02/23/2018  I ordered, reviewed and interpreted labs which include: BMP, CBC, respiratory panel  I ordered imaging studies including CT head, MRI brain I independently visualized and interpreted imaging which showed normal CT head.  MRI brain shows ischemic area to the right temporal lobe. I agree with the radiologist  interpretation  Consultations Obtained:  I requested consultation with the neurology Dr. Amada Jupiter,  and discussed lab and imaging findings as well as pertinent plan - they recommend: MRI brain, admit to hospitalist for stroke workup  I requested consultation with hospitalist Dr. Maisie Fus and discussed the plan.  She agrees for admission.  Afebrile, hemodynamically stable.  77 year old female presenting to the ED for evaluation of left upper extremity weakness and vision changes.  This lasted approximately 30 minutes and then resolved spontaneously.  On my exam, she has no focal neurologic deficits.  She reports her vision has returned to baseline.  Stroke labs ordered.  CT head unremarkable.  MRI of the brain reveals ischemic area to the right temporal lobe. Patient will be admitted to hospitalist per neurology recommendation for inpatient stroke workup.  Stable at the time of admission.  Patient's case discussed with  Dr. Durwin Nora who agrees with plan to admit for stroke workup.   Note: Portions of this report may have been transcribed using voice recognition software. Every effort was made to ensure accuracy; however, inadvertent computerized transcription errors may still be present.        Final Clinical Impression(s) / ED Diagnoses Final diagnoses:  Stroke-like symptoms    Rx / DC Orders ED Discharge Orders     None         Michelle Piper, Cordelia Poche 11/23/22 2050    Gloris Manchester, MD 11/24/22 1146

## 2022-11-23 NOTE — ED Notes (Signed)
ED TO INPATIENT HANDOFF REPORT  ED Nurse Name and Phone #: Albina Billet 19  S Name/Age/Gender Paula Moore 77 y.o. female Room/Bed: 012C/012C  Code Status   Code Status: Full Code  Home/SNF/Other Home Patient oriented to: self, place, time, and situation Is this baseline? Yes   Triage Complete: Triage complete  Chief Complaint Acute CVA (cerebrovascular accident) [facility]) [I63.9]  Triage Note The pt  has had a cold since last night  she experienced dizziness and weakness and her vision was dark  she took a baby aspirin her vision was dark then lightned  she  she has some lt arm weakness and  she thinks it getting better  her vision is clear now`   Allergies Allergies  Allergen Reactions   Cozaar [Losartan Potassium]     Disorientation    Lisinopril     coughing    Level of Care/Admitting Diagnosis ED Disposition   ED Disposition: Admit Condition: None Comment: Hospital Area: [place] [[id number]]  Level of Care: Progressive [102]  Admit to Progressive based on following criteria: NEUROLOGICAL AND NEUROSURGICAL complex patients with significant risk of instability, who do not meet ICU criteria, yet require close observation or frequent assessment (< / = every 2 - 4 hours) with medical / nursing intervention.  May admit patient to [facility] or [facility] if equivalent level of care is available:: Yes  Covid Evaluation: Confirmed COVID Negative  Diagnosis: Acute CVA (cerebrovascular accident) [facility]) [id number]  Admitting Physician: Clance Boll [id number]  Attending Physician: Clance Boll [id number]  Certification:: I certify this patient will need inpatient services for at least 2 midnights  Estimated Length of Stay: 3      B Medical/Surgery History Past Medical History:  Diagnosis Date   Diabetes mellitus without complication ([place])    prediabetic   High cholesterol    Hypertension    Shingles    Past Surgical History:  Procedure  Laterality Date   GUM SURGERY  1978     A IV Location/Drains/Wounds Patient Lines/Drains/Airways Status     Active Line/Drains/Airways     None            Intake/Output Last 24 hours No intake or output data in the 24 hours ending 11/23/22 2104  Labs/Imaging Results for orders placed or performed during the hospital encounter of 11/23/22 (from the past 48 hour(s))  Comprehensive metabolic panel     Status: Abnormal   Collection Time: 11/23/22  5:54 PM  Result Value Ref Range   Sodium 139 135 - 145 mmol/L   Potassium 3.6 3.5 - 5.1 mmol/L   Chloride 105 98 - 111 mmol/L   CO2 27 22 - 32 mmol/L   Glucose, Bld 105 (H) 70 - 99 mg/dL    Comment: Glucose reference range applies only to samples taken after fasting for at least 8 hours.   BUN 12 8 - 23 mg/dL   Creatinine, Ser 1.07 (H) 0.44 - 1.00 mg/dL   Calcium 9.4 8.9 - 10.3 mg/dL   Total Protein 7.6 6.5 - 8.1 g/dL   Albumin 3.7 3.5 - 5.0 g/dL   AST 21 15 - 41 U/L   ALT 15 0 - 44 U/L   Alkaline Phosphatase 72 38 - 126 U/L   Total Bilirubin 0.5 0.3 - 1.2 mg/dL   GFR, Estimated 55 (L) >60 mL/min    Comment: (NOTE) Calculated using the CKD-EPI Creatinine Equation (2021)    Anion gap 7 5 - 15  Comment: Performed at [facility], [place] [address] [place], [place] [address]  CBC with Differential     Status: None   Collection Time: 11/23/22  5:54 PM  Result Value Ref Range   WBC 5.4 4.0 - 10.5 K/uL   RBC 4.97 3.87 - 5.11 MIL/uL   Hemoglobin 14.1 12.0 - 15.0 g/dL   HCT 43.4 36.0 - 46.0 %   MCV 87.3 80.0 - 100.0 fL   MCH 28.4 26.0 - 34.0 pg   MCHC 32.5 30.0 - 36.0 g/dL   RDW 14.2 11.5 - 15.5 %   Platelets 297 150 - 400 K/uL   nRBC 0.0 0.0 - 0.2 %   Neutrophils Relative % 68 %   Neutro Abs 3.7 1.7 - 7.7 K/uL   Lymphocytes Relative 22 %   Lymphs Abs 1.2 0.7 - 4.0 K/uL   Monocytes Relative 9 %   Monocytes Absolute 0.5 0.1 - 1.0 K/uL   Eosinophils Relative 1 %   Eosinophils Absolute 0.1 0.0 - 0.5 K/uL    Basophils Relative 0 %   Basophils Absolute 0.0 0.0 - 0.1 K/uL   Immature Granulocytes 0 %   Abs Immature Granulocytes 0.02 0.00 - 0.07 K/uL    Comment: Performed at [facility], [address], [place], [place] [address]  Resp panel by RT-PCR (RSV, Flu A&B, Covid) Anterior Nasal Swab     Status: None   Collection Time: 11/23/22  7:27 PM   Specimen: Anterior Nasal Swab  Result Value Ref Range   SARS Coronavirus 2 by RT PCR NEGATIVE NEGATIVE   Influenza A by PCR NEGATIVE NEGATIVE   Influenza B by PCR NEGATIVE NEGATIVE    Comment: (NOTE) The Xpert Xpress SARS-CoV-2/FLU/RSV plus assay is intended as an aid in the diagnosis of influenza from Nasopharyngeal swab specimens and should not be used as a sole basis for treatment. Nasal washings and aspirates are unacceptable for Xpert Xpress SARS-CoV-2/FLU/RSV testing.  Fact Sheet for Patients: EntrepreneurPulse.com.au  Fact Sheet for Healthcare Providers: IncredibleEmployment.be  This test is not yet approved or cleared by the [place] FDA and has been authorized for detection and/or diagnosis of SARS-CoV-2 by FDA under an Emergency Use Authorization (EUA). This EUA will remain in effect (meaning this test can be used) for the duration of the COVID-19 declaration under Section 564(b)(1) of the Act, 21 U.S.C. section 360bbb-3(b)(1), unless the authorization is terminated or revoked.     Resp Syncytial Virus by PCR NEGATIVE NEGATIVE    Comment: (NOTE) Fact Sheet for Patients: EntrepreneurPulse.com.au  Fact Sheet for Healthcare Providers: IncredibleEmployment.be  This test is not yet approved or cleared by the [place] FDA and has been authorized for detection and/or diagnosis of SARS-CoV-2 by FDA under an Emergency Use Authorization (EUA). This EUA will remain in effect (meaning this test can be used) for the duration of the COVID-19 declaration  under Section 564(b)(1) of the Act, 21 U.S.C. section 360bbb-3(b)(1), unless the authorization is terminated or revoked.  Performed at [facility], [place] [address], [place], [place] [address]    MR BRAIN WO CONTRAST  Result Date: 11/23/2022 CLINICAL DATA:  Transient ischemic attack.  Vision changes. EXAM: MRI HEAD WITHOUT CONTRAST TECHNIQUE: Multiplanar, multiecho pulse sequences of the brain and surrounding structures were obtained without intravenous contrast. COMPARISON:  12/27/2009 FINDINGS: Brain: Small focus of acute ischemia at the right temporal/occipital junction. No acute hemorrhage. No chronic microhemorrhage or siderosis. There is multifocal hyperintense T2-weighted signal within  the white matter. Generalized volume loss. Old left basal ganglia small vessel infarct. The midline structures are normal. Vascular: Normal flow voids. Skull and upper cervical spine: Normal marrow signal. Sinuses/Orbits: Small amount of left mastoid fluid.  Normal orbits. Other: None IMPRESSION: 1. Small focus of acute ischemia at the right temporal/occipital junction. No hemorrhage or mass effect. 2. Old left basal ganglia small vessel infarct and findings of chronic microvascular ischemia. Electronically Signed   By: Ulyses Jarred M.D.   On: 11/23/2022 20:25   CT Head Wo Contrast  Result Date: 11/23/2022 CLINICAL DATA:  Transient ischemic attack. EXAM: CT HEAD WITHOUT CONTRAST TECHNIQUE: Contiguous axial images were obtained from the base of the skull through the vertex without intravenous contrast. RADIATION DOSE REDUCTION: This exam was performed according to the departmental dose-optimization program which includes automated exposure control, adjustment of the mA and/or kV according to patient size and/or use of iterative reconstruction technique. COMPARISON:  December 22, 2012 FINDINGS: Brain: There is mild cerebral atrophy with widening of the extra-axial spaces and ventricular dilatation. There are  areas of decreased attenuation within the white matter tracts of the supratentorial brain, consistent with microvascular disease changes. A small, chronic left basal ganglia lacunar infarct is noted. Vascular: There is marked severity bilateral cavernous carotid artery calcification. Skull: Normal. Negative for fracture or focal lesion. Sinuses/Orbits: No acute finding. Other: None. IMPRESSION: 1. No acute intracranial abnormality. 2. Generalized cerebral atrophy with widening of the extra-axial spaces and ventricular dilatation. 3. Small, chronic left basal ganglia lacunar infarct. Electronically Signed   By: Virgina Norfolk M.D.   On: 11/23/2022 18:52    Pending Labs Unresulted Labs (From admission, onward)     Start     Ordered   11/24/22 0500  Lipid panel  (Labs)  Tomorrow morning,   R       Comments: Fasting    11/23/22 2048   11/23/22 2200  C-reactive protein  Once,   R        11/23/22 2200   11/23/22 2045  Hemoglobin A1c  (Labs)  Once,   R       Comments: To assess prior glycemic control    11/23/22 2048   11/23/22 2045  CBC  (heparin)  Once,   R       Comments: Baseline for heparin therapy IF NOT ALREADY DRAWN.  Notify MD if PLT < 100 K.    11/23/22 2048   11/23/22 2045  Creatinine, serum  (heparin)  Once,   R       Comments: Baseline for heparin therapy IF NOT ALREADY DRAWN.    11/23/22 2048   11/23/22 2026  Sedimentation rate  Once,   URGENT        11/23/22 2025   11/23/22 2002  Protime-INR  (Stroke Panel (PNL))  ONCE - STAT,   STAT        11/23/22 2002   11/23/22 2002  APTT  (Stroke Panel (PNL))  ONCE - STAT,   STAT        11/23/22 2002   11/23/22 2002  Ethanol  (Stroke Panel (PNL))  Once,   URGENT        11/23/22 2002   11/23/22 2002  Urine rapid drug screen (hosp performed)  Once,   STAT        11/23/22 2002   11/23/22 2002  Urinalysis, Routine w reflex microscopic -Urine, Clean Catch  Once,   URGENT       Question:  Specimen Source  Answer:  Urine, Clean Catch    11/23/22 2002            Vitals/Pain Today's Vitals   11/23/22 1930 11/23/22 2030 11/23/22 2045 11/23/22 2100  BP: 129/66 (Abnormal) 159/66    Pulse: (Abnormal) 58 (Abnormal) 58    Resp: 18 18    Temp:      TempSrc:      SpO2: 100% 100% 100% 100%  Weight:      Height:      PainSc:        Isolation Precautions No active isolations  Medications Medications   stroke: early stages of recovery book (has no administration in time range)  0.9 %  sodium chloride infusion (has no administration in time range)  acetaminophen (TYLENOL) tablet 650 mg (has no administration in time range)    Or  acetaminophen (TYLENOL) 160 MG/5ML solution 650 mg (has no administration in time range)    Or  acetaminophen (TYLENOL) suppository 650 mg (has no administration in time range)  senna-docusate (Senokot-S) tablet 1 tablet (has no administration in time range)  heparin injection 5,000 Units (has no administration in time range)    Mobility walks     Focused Assessments Neuro Assessment Handoff:  Swallow screen pass? Yes    NIH Stroke Scale  Dizziness Present: Yes Headache Present: No Interval: Initial Level of Consciousness (1a.)   : Alert, keenly responsive LOC Questions (1b. )   : Answers both questions correctly LOC Commands (1c. )   : Performs both tasks correctly Best Gaze (2. )  : Normal Visual (3. )  : No visual loss Facial Palsy (4. )    : Normal symmetrical movements Motor Arm, Left (5a. )   : No drift Motor Arm, Right (5b. ) : No drift Motor Leg, Left (6a. )  : No drift Motor Leg, Right (6b. ) : No drift Limb Ataxia (7. ): Absent Sensory (8. )  : Normal, no sensory loss Best Language (9. )  : No aphasia Dysarthria (10. ): Normal Extinction/Inattention (11.)   : No Abnormality Complete NIHSS TOTAL: 0     Neuro Assessment:   Neuro Checks:   Initial (11/23/22 1829)  Has TPA been given? No If patient is a Neuro Trauma and patient is going to OR before floor call  report to [place] nurse: [phone] or [phone]   R Recommendations: See Admitting Provider Note  Report given to:   Additional Notes:

## 2022-11-23 NOTE — Consult Note (Signed)
Neurology Consultation Reason for Consult: Left-sided numbness and weakness Referring Physician: Patient  CC: Left-sided numbness  History is obtained from: Patient  HPI: Paula Moore is a 77 y.o. female who was in her normal state of health until earlier today around 58.  She states that she was making sandwiches in the kitchen, when she suddenly felt very lightheaded.  She also noticed that her left arm was weak and numb.  She went in to sit down, but still felt generally weak and like her left arm was weak for about 30 minutes.  Following this, she has improved, but she still feels like her left arm is not completely 100% back to normal.   LKW: 4:15 PM  tnk given?: no, mild symptoms   Past Medical History:  Diagnosis Date   Diabetes mellitus without complication ([place])    prediabetic   High cholesterol    Hypertension    Shingles      Family History  Problem Relation Age of Onset   Heart failure Mother    Heart failure Father    Cancer Sister    Rectal cancer Brother        late 92's   Colon polyps Daughter    Colon cancer Neg Hx    Esophageal cancer Neg Hx    Stomach cancer Neg Hx      Social History:  reports that she has never smoked. She has never used smokeless tobacco. She reports that she does not drink alcohol and does not use drugs.   Exam: Current vital signs: BP (!) 146/72   Pulse 69   Temp 98.4 F (36.9 C) (Oral)   Resp 14   Ht 5\' 1"  (1.549 m)   Wt 76.7 kg   SpO2 100%   BMI 31.95 kg/m  Vital signs in last 24 hours: Temp:  [98.4 F (36.9 C)-98.7 F (37.1 C)] 98.4 F (36.9 C) (03/16 2135) Pulse Rate:  [55-75] 69 (03/16 2200) Resp:  [14-19] 14 (03/16 2200) BP: (100-175)/(58-72) 146/72 (03/16 2200) SpO2:  [99 %-100 %] 100 % (03/16 2200) FiO2 (%):  [21 %] 21 % (03/16 2045) Weight:  [76.7 kg] 76.7 kg (03/16 1745)   Physical Exam  Appears well-developed and well-nourished.   Neuro: Mental Status: Patient is awake, alert, oriented to  person, place, month, year, and situation. Patient is able to give a clear and coherent history. No signs of aphasia or neglect Cranial Nerves: II: Visual Fields are full. Pupils are equal, round, and reactive to light.   III,IV, VI: EOMI without ptosis or diploplia.  V: Facial sensation is symmetric to temperature VII: Facial movement is symmetric.  VIII: hearing is intact to voice X: Uvula elevates symmetrically XI: Shoulder shrug is symmetric. XII: tongue is midline without atrophy or fasciculations.  Motor: Tone is normal. Bulk is normal. 5/5 strength was present in all four extremities.  Sensory: Sensation is mildly diminished in the left arm Cerebellar: FNF and HKS are intact bilaterally   I have reviewed labs in epic and the results pertinent to this consultation are: Creatinine 1.07  I have reviewed the images obtained: MRI brain-punctate infarct in the right temporal/occipital junction  Impression: 77 year old female with a history of previous TIA who presents with transient left arm weakness and numbness, with small residual area of ischemia on MRI.  She is being admitted for secondary risk factor modification.  Recommendations: - HgbA1c, fasting lipid panel - Frequent neuro checks - Echocardiogram - CTA head and neck -  Prophylactic therapy-Antiplatelet med: Aspirin - dose 81mg  and plavix 75mg  daily  after 300mg  load  - Risk factor modification - Telemetry monitoring - PT consult, OT consult, Speech consult - Stroke team to follow    Roland Rack, MD [facility] [phone]  If 7pm- 7am, please page neurology on call as listed in [place].

## 2022-11-23 NOTE — ED Notes (Signed)
Patient transported to X-ray 

## 2022-11-23 NOTE — ED Notes (Signed)
Patient has returned to room from CT

## 2022-11-24 ENCOUNTER — Inpatient Hospital Stay (HOSPITAL_BASED_OUTPATIENT_CLINIC_OR_DEPARTMENT_OTHER): Payer: Medicare Other

## 2022-11-24 ENCOUNTER — Other Ambulatory Visit: Payer: Self-pay | Admitting: Physician Assistant

## 2022-11-24 DIAGNOSIS — I639 Cerebral infarction, unspecified: Secondary | ICD-10-CM

## 2022-11-24 DIAGNOSIS — I6389 Other cerebral infarction: Secondary | ICD-10-CM

## 2022-11-24 LAB — ECHOCARDIOGRAM COMPLETE BUBBLE STUDY
AR max vel: 1.69 cm2
AV Area VTI: 2 cm2
AV Area mean vel: 1.76 cm2
AV Mean grad: 6 mmHg
AV Peak grad: 13.1 mmHg
Ao pk vel: 1.81 m/s
Area-P 1/2: 4.19 cm2
S' Lateral: 2.9 cm
Single Plane A4C EF: 68 %

## 2022-11-24 LAB — LIPID PANEL
Cholesterol: 176 mg/dL (ref 0–200)
HDL: 45 mg/dL (ref >40)
LDL Cholesterol: 116 mg/dL — ABNORMAL HIGH (ref 0–99)
Total CHOL/HDL Ratio: 3.9 RATIO
Triglycerides: 75 mg/dL (ref <150)
VLDL: 15 mg/dL (ref 0–40)

## 2022-11-24 LAB — RESPIRATORY PANEL BY PCR

## 2022-11-24 MED ORDER — CLOPIDOGREL BISULFATE 300 MG PO TABS
300.0000 mg | ORAL_TABLET | Freq: Once | ORAL | Status: AC
  Administered 2022-11-24: 300 mg via ORAL
  Filled 2022-11-24: qty 1

## 2022-11-24 MED ORDER — HYDRALAZINE HCL 20 MG/ML IJ SOLN: 10.0000 mg | Freq: Four times a day (QID) | INTRAMUSCULAR | Status: DC | PRN

## 2022-11-24 MED ORDER — ASPIRIN 81 MG PO TBEC
81.0000 mg | DELAYED_RELEASE_TABLET | Freq: Every day | ORAL | Status: DC
  Administered 2022-11-24: 81 mg via ORAL
  Filled 2022-11-24: qty 1

## 2022-11-24 MED ORDER — ASPIRIN 81 MG PO CHEW: 81.0000 mg | CHEWABLE_TABLET | Freq: Every day | ORAL | 11 refills | Status: AC

## 2022-11-24 MED ORDER — LATANOPROST 0.005 % OP SOLN
1.0000 [drp] | Freq: Every day | OPHTHALMIC | Status: DC
  Filled 2022-11-24: qty 2.5

## 2022-11-24 MED ORDER — ADULT MULTIVITAMIN W/MINERALS CH
ORAL_TABLET | Freq: Every day | ORAL | Status: DC
  Administered 2022-11-24: 1 via ORAL
  Filled 2022-11-24: qty 1

## 2022-11-24 MED ORDER — ROSUVASTATIN CALCIUM 20 MG PO TABS
20.0000 mg | ORAL_TABLET | Freq: Every day | ORAL | Status: DC
  Administered 2022-11-24: 20 mg via ORAL
  Filled 2022-11-24: qty 1

## 2022-11-24 MED ORDER — CLOPIDOGREL BISULFATE 75 MG PO TABS: 75.0000 mg | ORAL_TABLET | Freq: Every day | ORAL | Status: DC

## 2022-11-24 MED ORDER — ROSUVASTATIN CALCIUM 20 MG PO TABS: 20.0000 mg | ORAL_TABLET | Freq: Every day | ORAL | 11 refills | Status: AC

## 2022-11-24 MED ORDER — CLOPIDOGREL BISULFATE 75 MG PO TABS: 75.0000 mg | ORAL_TABLET | Freq: Every day | ORAL | 0 refills | Status: AC

## 2022-11-24 MED ORDER — PRAVASTATIN SODIUM 40 MG PO TABS: 80.0000 mg | ORAL_TABLET | Freq: Every day | ORAL | Status: DC

## 2022-11-24 NOTE — Progress Notes (Addendum)
STROKE TEAM PROGRESS NOTE   INTERVAL HISTORY No family at the bedside.  2D echo being done currently.  She presented with dizziness, vision changes and left-sided numbness and weakness she states her symptoms have resolved.  MRI brain with a small acute ischemic stroke right temporal occipital.  Recommend 30-day monitor and then possibly a loop recorder Recommend aspirin 81 mg and Plavix 75 mg daily for 3 weeks then aspirin alone  Vitals:   11/23/22 2325 11/24/22 0000 11/24/22 0035 11/24/22 0730  BP: (!) 156/88 (!) 161/70 134/72 (!) 141/75  Pulse: 66 60 72 68  Resp: 16 (!) 21 (!) 21 18  Temp: 98.3 F (36.8 C)   98 F (36.7 C)  TempSrc: Oral   Oral  SpO2: 99% 100% 97% 100%  Weight:      Height:       CBC:  Recent Labs  Lab 11/23/22 1754  WBC 5.4  NEUTROABS 3.7  HGB 14.1  HCT 43.4  MCV 87.3  PLT 123XX123   Basic Metabolic Panel:  Recent Labs  Lab 11/23/22 1754  NA 139  K 3.6  CL 105  CO2 27  GLUCOSE 105*  BUN 12  CREATININE 1.07*  CALCIUM 9.4   Lipid Panel:  Recent Labs  Lab 11/24/22 0442  CHOL 176  TRIG 75  HDL 45  CHOLHDL 3.9  VLDL 15  LDLCALC 116*   HgbA1c: No results for input(s): "HGBA1C" in the last 168 hours. Urine Drug Screen:  Recent Labs  Lab 11/23/22 2245  LABOPIA NONE DETECTED  COCAINSCRNUR NONE DETECTED  LABBENZ NONE DETECTED  AMPHETMU NONE DETECTED  THCU NONE DETECTED  LABBARB NONE DETECTED    Alcohol Level  Recent Labs  Lab 11/23/22 2027  ETH <10    IMAGING past 24 hours ECHOCARDIOGRAM COMPLETE BUBBLE STUDY  Result Date: 11/24/2022    ECHOCARDIOGRAM REPORT   Patient Name:   Paula Moore Date of Exam: 11/24/2022 Medical Rec #:  [id number]     Height:       61.0 in Accession #:    [id number]    Weight:       169.1 lb Date of Birth:  04/01/1946     BSA:          1.759 m Patient Age:    77 years      BP:           148/77 mmHg Patient Gender: F             HR:           67 bpm. Exam Location:  Inpatient Procedure: Saline Contrast Bubble  Study, 2D Echo, Cardiac Doppler and Color            Doppler Indications:    Stroke  History:        Patient has prior history of Echocardiogram examinations, most                 recent 12/13/2014. Signs/Symptoms:Chest Pain; Risk                 Factors:Hypertension, Diabetes and High Cholesterol.  Sonographer:    Marella Chimes Referring Phys: Myles Rosenthal, A IMPRESSIONS  1. Left ventricular ejection fraction, by estimation, is 65 to 70%. The left ventricle has normal function. The left ventricle has no regional wall motion abnormalities. Left ventricular diastolic parameters are indeterminate.  2. Right ventricular systolic function is normal. The right ventricular size is normal.  3. The mitral valve is  normal in structure. No evidence of mitral valve regurgitation. No evidence of mitral stenosis.  4. The aortic valve is tricuspid. There is moderate calcification of the aortic valve. There is moderate thickening of the aortic valve. Aortic valve regurgitation is mild. Aortic valve sclerosis is present, with no evidence of aortic valve stenosis. Aortic valve mean gradient measures 6.0 mmHg. Aortic valve Vmax measures 1.81 m/s.  5. The inferior vena cava is normal in size with greater than 50% respiratory variability, suggesting right atrial pressure of 3 mmHg.  6. Agitated saline contrast bubble study was negative, with no evidence of any interatrial shunt. Conclusion(s)/Recommendation(s): No intracardiac source of embolism detected on this transthoracic study. Consider a transesophageal echocardiogram to exclude cardiac source of embolism if clinically indicated. FINDINGS  Left Ventricle: Left ventricular ejection fraction, by estimation, is 65 to 70%. The left ventricle has normal function. The left ventricle has no regional wall motion abnormalities. The left ventricular internal cavity size was normal in size. There is  no left ventricular hypertrophy. Left ventricular diastolic parameters are indeterminate.  Right Ventricle: The right ventricular size is normal. No increase in right ventricular wall thickness. Right ventricular systolic function is normal. Left Atrium: Left atrial size was normal in size. Right Atrium: Right atrial size was normal in size. Pericardium: There is no evidence of pericardial effusion. Mitral Valve: The mitral valve is normal in structure. No evidence of mitral valve regurgitation. No evidence of mitral valve stenosis. Tricuspid Valve: The tricuspid valve is normal in structure. Tricuspid valve regurgitation is not demonstrated. No evidence of tricuspid stenosis. Aortic Valve: The aortic valve is tricuspid. There is moderate calcification of the aortic valve. There is moderate thickening of the aortic valve. Aortic valve regurgitation is mild. Aortic valve sclerosis is present, with no evidence of aortic valve stenosis. Aortic valve mean gradient measures 6.0 mmHg. Aortic valve peak gradient measures 13.1 mmHg. Aortic valve area, by VTI measures 2.00 cm. Pulmonic Valve: The pulmonic valve was normal in structure. Pulmonic valve regurgitation is not visualized. No evidence of pulmonic stenosis. Aorta: The aortic root is normal in size and structure. Venous: The inferior vena cava is normal in size with greater than 50% respiratory variability, suggesting right atrial pressure of 3 mmHg. IAS/Shunts: No atrial level shunt detected by color flow Doppler. Agitated saline contrast was given intravenously to evaluate for intracardiac shunting. Agitated saline contrast bubble study was negative, with no evidence of any interatrial shunt. There  is no evidence of a patent foramen ovale. There is no evidence of an atrial septal defect.  LEFT VENTRICLE PLAX 2D LVIDd:         4.30 cm     Diastology LVIDs:         2.90 cm     LV e' medial:    7.40 cm/s LV PW:         0.80 cm     LV E/e' medial:  16.8 LV IVS:        0.80 cm     LV e' lateral:   8.59 cm/s LVOT diam:     1.70 cm     LV E/e' lateral: 14.4  LV SV:         73 LV SV Index:   41 LVOT Area:     2.27 cm  LV Volumes (MOD) LV vol d, MOD A4C: 75.3 ml LV vol s, MOD A4C: 24.1 ml LV SV MOD A4C:     75.3 ml RIGHT VENTRICLE RV S  prime:     13.80 cm/s TAPSE (M-mode): 2.7 cm LEFT ATRIUM             Index        RIGHT ATRIUM           Index LA Vol (A2C):   43.8 ml 24.90 ml/m  RA Area:     10.20 cm LA Vol (A4C):   34.8 ml 19.79 ml/m  RA Volume:   22.10 ml  12.57 ml/m LA Biplane Vol: 40.0 ml 22.74 ml/m  AORTIC VALVE AV Area (Vmax):    1.69 cm AV Area (Vmean):   1.76 cm AV Area (VTI):     2.00 cm AV Vmax:           181.00 cm/s AV Vmean:          111.000 cm/s AV VTI:            0.365 m AV Peak Grad:      13.1 mmHg AV Mean Grad:      6.0 mmHg LVOT Vmax:         135.00 cm/s LVOT Vmean:        86.300 cm/s LVOT VTI:          0.321 m LVOT/AV VTI ratio: 0.88  AORTA Ao Root diam: 2.80 cm Ao Asc diam:  2.90 cm MITRAL VALVE MV Area (PHT): 4.19 cm     SHUNTS MV Decel Time: 181 msec     Systemic VTI:  0.32 m MV E velocity: 124.00 cm/s  Systemic Diam: 1.70 cm MV A velocity: 133.00 cm/s MV E/A ratio:  0.93 Candee Furbish MD Electronically signed by Candee Furbish MD Signature Date/Time: 11/24/2022/10:59:46 AM    Final    CT ANGIO HEAD NECK W WO CM  Result Date: 11/23/2022 CLINICAL DATA:  Dizziness and vision changes EXAM: CT ANGIOGRAPHY HEAD AND NECK TECHNIQUE: Multidetector CT imaging of the head and neck was performed using the standard protocol during bolus administration of intravenous contrast. Multiplanar CT image reconstructions and MIPs were obtained to evaluate the vascular anatomy. Carotid stenosis measurements (when applicable) are obtained utilizing NASCET criteria, using the distal internal carotid diameter as the denominator. RADIATION DOSE REDUCTION: This exam was performed according to the departmental dose-optimization program which includes automated exposure control, adjustment of the mA and/or kV according to patient size and/or use of iterative  reconstruction technique. CONTRAST:  36mL OMNIPAQUE IOHEXOL 350 MG/ML SOLN COMPARISON:  None Available. FINDINGS: CTA NECK FINDINGS SKELETON: There is no bony spinal canal stenosis. No lytic or blastic lesion. OTHER NECK: Normal pharynx, larynx and major salivary glands. No cervical lymphadenopathy. Unremarkable thyroid gland. UPPER CHEST: No pneumothorax or pleural effusion. No nodules or masses. AORTIC ARCH: There is calcific atherosclerosis of the aortic arch. There is no aneurysm, dissection or hemodynamically significant stenosis of the visualized portion of the aorta. Normal variant aortic arch branching pattern with the brachiocephalic and left common carotid arteries sharing a common origin. The visualized proximal subclavian arteries are widely patent. RIGHT CAROTID SYSTEM: No dissection, occlusion or aneurysm. There is calcified atherosclerosis extending into the proximal ICA, resulting in less than 50% stenosis. LEFT CAROTID SYSTEM: No dissection, occlusion or aneurysm. Mild atherosclerotic calcification at the carotid bifurcation without hemodynamically significant stenosis. VERTEBRAL ARTERIES: Right dominant configuration. Both origins are clearly patent. There is no dissection, occlusion or flow-limiting stenosis to the skull base (V1-V3 segments). CTA HEAD FINDINGS POSTERIOR CIRCULATION: --Vertebral arteries: Normal V4 segments. --Inferior cerebellar arteries: Normal. --Basilar artery: Normal. --  Superior cerebellar arteries: Normal. --Posterior cerebral arteries (PCA): Normal. ANTERIOR CIRCULATION: --Intracranial internal carotid arteries: Atherosclerotic calcification of the internal carotid arteries at the skull base without hemodynamically significant stenosis. --Anterior cerebral arteries (ACA): Normal. Both A1 segments are present. Patent anterior communicating artery (a-comm). --Middle cerebral arteries (MCA): Normal. VENOUS SINUSES: As permitted by contrast timing, patent. ANATOMIC VARIANTS:  Fetal origins of both posterior cerebral arteries. Review of the MIP images confirms the above findings. IMPRESSION: 1. No emergent large vessel occlusion or hemodynamically significant stenosis of the head or neck. 2. Bilateral carotid bifurcation atherosclerosis without hemodynamically significant stenosis. Aortic atherosclerosis (ICD10-I70.0). Electronically Signed   By: Ulyses Jarred M.D.   On: 11/23/2022 23:26   DG Chest 2 View  Result Date: 11/23/2022 CLINICAL DATA:  CTA EXAM: CHEST - 2 VIEW COMPARISON:  12/12/2014 FINDINGS: Heart and mediastinal contours are within normal limits. No focal opacities or effusions. No acute bony abnormality. IMPRESSION: No active cardiopulmonary disease. Electronically Signed   By: Rolm Baptise M.D.   On: 11/23/2022 22:25   MR BRAIN WO CONTRAST  Result Date: 11/23/2022 CLINICAL DATA:  Transient ischemic attack.  Vision changes. EXAM: MRI HEAD WITHOUT CONTRAST TECHNIQUE: Multiplanar, multiecho pulse sequences of the brain and surrounding structures were obtained without intravenous contrast. COMPARISON:  12/27/2009 FINDINGS: Brain: Small focus of acute ischemia at the right temporal/occipital junction. No acute hemorrhage. No chronic microhemorrhage or siderosis. There is multifocal hyperintense T2-weighted signal within the white matter. Generalized volume loss. Old left basal ganglia small vessel infarct. The midline structures are normal. Vascular: Normal flow voids. Skull and upper cervical spine: Normal marrow signal. Sinuses/Orbits: Small amount of left mastoid fluid.  Normal orbits. Other: None IMPRESSION: 1. Small focus of acute ischemia at the right temporal/occipital junction. No hemorrhage or mass effect. 2. Old left basal ganglia small vessel infarct and findings of chronic microvascular ischemia. Electronically Signed   By: Ulyses Jarred M.D.   On: 11/23/2022 20:25   CT Head Wo Contrast  Result Date: 11/23/2022 CLINICAL DATA:  Transient ischemic attack.  EXAM: CT HEAD WITHOUT CONTRAST TECHNIQUE: Contiguous axial images were obtained from the base of the skull through the vertex without intravenous contrast. RADIATION DOSE REDUCTION: This exam was performed according to the departmental dose-optimization program which includes automated exposure control, adjustment of the mA and/or kV according to patient size and/or use of iterative reconstruction technique. COMPARISON:  December 22, 2012 FINDINGS: Brain: There is mild cerebral atrophy with widening of the extra-axial spaces and ventricular dilatation. There are areas of decreased attenuation within the white matter tracts of the supratentorial brain, consistent with microvascular disease changes. A small, chronic left basal ganglia lacunar infarct is noted. Vascular: There is marked severity bilateral cavernous carotid artery calcification. Skull: Normal. Negative for fracture or focal lesion. Sinuses/Orbits: No acute finding. Other: None. IMPRESSION: 1. No acute intracranial abnormality. 2. Generalized cerebral atrophy with widening of the extra-axial spaces and ventricular dilatation. 3. Small, chronic left basal ganglia lacunar infarct. Electronically Signed   By: Virgina Norfolk M.D.   On: 11/23/2022 18:52    PHYSICAL EXAM  Temp:  [98 F (36.7 C)-98.7 F (37.1 C)] 98 F (36.7 C) (03/17 0730) Pulse Rate:  [55-75] 68 (03/17 0730) Resp:  [14-21] 18 (03/17 0730) BP: (100-175)/(58-88) 141/75 (03/17 0730) SpO2:  [97 %-100 %] 100 % (03/17 0730) FiO2 (%):  [21 %] 21 % (03/16 2045) Weight:  [76.7 kg] 76.7 kg (03/16 1745)  General - Well nourished, well developed, in no apparent  distress. Cardiovascular - Regular rhythm and rate.  Mental Status -  Level of arousal and orientation to time, place, and person were intact. Language including expression, naming, repetition, comprehension was assessed and found intact. Attention span and concentration were normal. Recent and remote memory were  intact. Fund of Knowledge was assessed and was intact.  Cranial Nerves II - XII - II - Visual field intact OU. III, IV, VI - Extraocular movements intact. V - Facial sensation intact bilaterally. VII - Facial movement intact bilaterally. VIII - Hearing & vestibular intact bilaterally. X - Palate elevates symmetrically. XI - Chin turning & shoulder shrug intact bilaterally. XII - Tongue protrusion intact.  Motor Strength - The patient's strength was normal in all extremities and pronator drift was absent.  Bulk was normal and fasciculations were absent.   Motor Tone - Muscle tone was assessed at the neck and appendages and was normal.  Sensory - Light touch, temperature/pinprick were assessed and were symmetrical.    Coordination - The patient had normal movements in the hands and feet with no ataxia or dysmetria.  Tremor was absent.  Gait and Station - deferred.  ASSESSMENT/PLAN Paula Moore is a 77 y.o. female with history of pre-diabetes hypertension hyperlipidemia who presents to the emergency room for left-sided weakness and numbness vision changes, and dizziness.  MRI brain with small ischemic stroke  Stroke: small acute right temporal occipital ischemic stroke, possible small vessel disease vs. Cardioembolic source CT head No acute abnormality. Chronic left BG infarct CTA head & neck no LVO MRI small right temporal occipital ischemic stroke.  Left basal ganglia chronic infarct 2D Echo  EF 65 to 70% no shunt LDL 116 HgbA1c pending UDS neg VTE prophylaxis -hep SQ Recommend 30-day monitoring to rule out afib and if negative, may consider loop recorder placement No antithrombotic prior to admission, now on aspirin 81 mg daily and clopidogrel 75 mg daily for 3 weeks then aspirin alone therapy Therapy recommendations: Outpatient PT Disposition: home today  Hypertension Home meds: Norvasc 10 mg Stable Long-term BP goal normotensive  Hyperlipidemia Home meds:  Pravastatin 80 mg LDL 116, goal < 70 Pravastatin changed to Crestor 20 mg Continue statin at discharge  Other Stroke Risk Factors Advanced Age >/= 58  Obesity, Body mass index is 31.95 kg/m., BMI >/= 30 associated with increased stroke risk, recommend weight loss, diet and exercise as appropriate   Hospital day # 1  Beulah Gandy DNP, ACNPC-AG  [facility]  ATTENDING NOTE: I reviewed above note and agree with the assessment and plan. Pt was seen and examined.   Pt lying in bed, no family at the bedside. Pt stated that her left arm weakness and numbness has gone, now back to baseline. Neuro exam intact. MRI showed right parietooccipital small infarct. Stroke work up so far negative except LDL 116. Etiology for her stroke is not quite clear, there is concern for cardioembolic source, will do 30 day cardiac event monitoring. If neg, may consider loop recorder. Pt does have stroke risk factors and MRI/CT showed chronic left BG infarct, so her current small lacunar stroke may from small vessel disease too. Educated on aggressive stroke risk factor modification, will do DAPT for 3 weeks and then ASA alone, change pravastatin to crestor 20. PT/OT recommend outpt.   For detailed assessment and plan, please refer to above/below as I have made changes wherever appropriate.   Neurology will sign off. Please call with questions. Pt will follow up with stroke clinic  [facility] at [facility] in about 4 weeks. Thanks for the consult.   Rosalin Hawking, MD PhD Stroke Neurology 11/24/2022 12:17 PM     To contact Stroke Continuity provider, please refer to [email or web address] After hours, contact General Neurology

## 2022-11-24 NOTE — Discharge Summary (Signed)
PATIENT DETAILS Name: Paula Moore Age: 77 y.o. Sex: female Date of Birth: 05-Jan-1946 MRN: [id number]. Admitting Physician: Clance Boll, MD [id number], Jannifer Rodney, MD  Admit Date: 11/23/2022 Discharge date: 11/24/2022  Recommendations for Outpatient Follow-up:  Follow up with PCP in 1-2 weeks Please obtain CMP/CBC in one week Please ensure outpatient follow-up with cardiology for event monitor Please ensure outpatient follow-up with stroke clinic  Admitted From:  Home  Disposition: Outpatient PT   Discharge Condition: good  CODE STATUS:   Code Status: Full Code   Diet recommendation:  Diet Order             Diet - low sodium heart healthy           Diet Carb Modified           Diet heart healthy/carb modified Room service appropriate? Yes; Fluid consistency: Thin  Diet effective now                    Brief Summary: 77 year old female with history of DM-2, HLD, HTN, prior TIA-stopped taking ASA October 2023 who presented with 20 minutes episode of vision change, dizziness and left upper extremity weakness.  MRI brain showed a small CVA at the right temporal/occipital junction.  Significant studies 3/16>> MRI brain: Acute infarct right temporal/occipital junction 3/16>> CT head/neck: No LVO/hemodynamically significant stenosis 3/16>> LDL: 116 3/16>> A1c: Pending 3/16>> echo: EF 65-70%   Brief Hospital Course: Acute CVA Possible embolic etiology No focal deficits-presenting with vision issues/LUE weakness/dizziness have completely resolved Workup as above Discussed with neurology-recommendations are for aspirin/Plavix x 3 weeks followed by aspirin alone Epic message sent to cardiology to arrange for outpatient cardiac event monitor.  No A-fib detected on telemetry monitor overnight. Will need follow-up at the stroke clinic.  HLD Switch from Pravachol to Crestor  HTN Be stable Resume amlodipine postdischarge  ?  DM-2 A1c currently  pending-last A1c in 2016 was 6.2 Follow with PCP for further ongoing care  Obesity: Estimated body mass index is 31.95 kg/m as calculated from the following:   Height as of this encounter: 5\' 1"  (1.549 m).   Weight as of this encounter: 76.7 kg.    Discharge Diagnoses:  Principal Problem:   Acute CVA (cerebrovascular accident) [facility])   Discharge Instructions:  Activity:  As tolerated  Discharge Instructions     Ambulatory referral to Neurology   Complete by: As directed    An appointment is requested in approximately: 8 weeks   Call MD for:  extreme fatigue   Complete by: As directed    Call MD for:  persistant dizziness or light-headedness   Complete by: As directed    Diet - low sodium heart healthy   Complete by: As directed    Diet Carb Modified   Complete by: As directed    Discharge instructions   Complete by: As directed    Follow with Primary MD  Katherina Mires, MD in 1-2 weeks  Please follow-up with [facility]-the office will give you a call  Take aspirin/Plavix x 3 weeks, after 3 weeks stop Plavix and continue on aspirin  We have changed your cholesterol medications to Crestor-do not take Pravachol.  Please get a complete blood count and chemistry panel checked by your Primary MD at your next visit, and again as instructed by your Primary MD.  Get Medicines reviewed and adjusted: Please take all your medications with you for your next visit with your Primary MD  Laboratory/radiological data: Please request your Primary MD to go over all hospital tests and procedure/radiological results at the follow up, please ask your Primary MD to get all Hospital records sent to his/her office.  In some cases, they will be blood work, cultures and biopsy results pending at the time of your discharge. Please request that your primary care M.D. follows up on these results.  Also Note the following: If you experience worsening of your admission symptoms, develop  shortness of breath, life threatening emergency, suicidal or homicidal thoughts you must seek medical attention immediately by calling 911 or calling your MD immediately  if symptoms less severe.  You must read complete instructions/literature along with all the possible adverse reactions/side effects for all the Medicines you take and that have been prescribed to you. Take any new Medicines after you have completely understood and accpet all the possible adverse reactions/side effects.   Do not drive when taking Pain medications or sleeping medications (Benzodaizepines)  Do not take more than prescribed Pain, Sleep and Anxiety Medications. It is not advisable to combine anxiety,sleep and pain medications without talking with your primary care practitioner  Special Instructions: If you have smoked or chewed Tobacco  in the last 2 yrs please stop smoking, stop any regular Alcohol  and or any Recreational drug use.  Wear Seat belts while driving.  Please note: You were cared for by a hospitalist during your hospital stay. Once you are discharged, your primary care physician will handle any further medical issues. Please note that NO REFILLS for any discharge medications will be authorized once you are discharged, as it is imperative that you return to your primary care physician (or establish a relationship with a primary care physician if you do not have one) for your post hospital discharge needs so that they can reassess your need for medications and monitor your lab values.   Increase activity slowly   Complete by: As directed       Allergies as of 11/24/2022       Reactions   Cozaar [losartan Potassium]    Disorientation   Lisinopril    coughing        Medication List     STOP taking these medications    pravastatin 80 MG tablet Commonly known as: PRAVACHOL       TAKE these medications    alendronate 70 MG tablet Commonly known as: FOSAMAX Take 70 mg by mouth once a  week. Thursday   Alive Once Daily Womens 50+ Tabs Take 1 tablet by mouth daily.   amLODipine 10 MG tablet Commonly known as: NORVASC Take 1 tablet (10 mg total) by mouth daily.   aspirin 81 MG chewable tablet Chew 1 tablet (81 mg total) by mouth daily.   clopidogrel 75 MG tablet Commonly known as: PLAVIX Take 1 tablet (75 mg total) by mouth daily. Take along with aspirin for 3 weeks, after 3 weeks stop Plavix and continue on aspirin. Start taking on: November 25, 2022   latanoprost 0.005 % ophthalmic solution Commonly known as: XALATAN Place 1 drop into both eyes at bedtime.   OVER THE COUNTER MEDICATION Take 1 tablet by mouth daily. Vein Circulation supplement   rosuvastatin 20 MG tablet Commonly known as: CRESTOR Take 1 tablet (20 mg total) by mouth daily. Start taking on: November 25, 2022        Follow-up Information     Garvin Fila, MD Follow up.   Specialties: Neurology, Radiology Why: Office  will call with date/time, If you dont hear from them,please give them a call Contact information: [address] [place] [address] [phone]         Katherina Mires, MD. Schedule an appointment as soon as possible for a visit in 1 week(s).   Specialty: Family Medicine Contact information: [address] [place] [place] [address] [phone]                Allergies  Allergen Reactions   Cozaar [Losartan Potassium]     Disorientation    Lisinopril     coughing     Other Procedures/Studies: ECHOCARDIOGRAM COMPLETE BUBBLE STUDY  Result Date: 11/24/2022    ECHOCARDIOGRAM REPORT   Patient Name:   Paula Moore Date of Exam: 11/24/2022 Medical Rec #:  [id number]     Height:       61.0 in Accession #:    [id number]    Weight:       169.1 lb Date of Birth:  08-Aug-1946     BSA:          1.759 m Patient Age:    22 years      BP:           148/77 mmHg Patient Gender: F             HR:           67 bpm. Exam Location:  Inpatient  Procedure: Saline Contrast Bubble Study, 2D Echo, Cardiac Doppler and Color            Doppler Indications:    Stroke  History:        Patient has prior history of Echocardiogram examinations, most                 recent 12/13/2014. Signs/Symptoms:Chest Pain; Risk                 Factors:Hypertension, Diabetes and High Cholesterol.  Sonographer:    Marella Chimes Referring Phys: Myles Rosenthal, A IMPRESSIONS  1. Left ventricular ejection fraction, by estimation, is 65 to 70%. The left ventricle has normal function. The left ventricle has no regional wall motion abnormalities. Left ventricular diastolic parameters are indeterminate.  2. Right ventricular systolic function is normal. The right ventricular size is normal.  3. The mitral valve is normal in structure. No evidence of mitral valve regurgitation. No evidence of mitral stenosis.  4. The aortic valve is tricuspid. There is moderate calcification of the aortic valve. There is moderate thickening of the aortic valve. Aortic valve regurgitation is mild. Aortic valve sclerosis is present, with no evidence of aortic valve stenosis. Aortic valve mean gradient measures 6.0 mmHg. Aortic valve Vmax measures 1.81 m/s.  5. The inferior vena cava is normal in size with greater than 50% respiratory variability, suggesting right atrial pressure of 3 mmHg.  6. Agitated saline contrast bubble study was negative, with no evidence of any interatrial shunt. Conclusion(s)/Recommendation(s): No intracardiac source of embolism detected on this transthoracic study. Consider a transesophageal echocardiogram to exclude cardiac source of embolism if clinically indicated. FINDINGS  Left Ventricle: Left ventricular ejection fraction, by estimation, is 65 to 70%. The left ventricle has normal function. The left ventricle has no regional wall motion abnormalities. The left ventricular internal cavity size was normal in size. There is  no left ventricular hypertrophy. Left ventricular  diastolic parameters are indeterminate. Right Ventricle: The right ventricular size is normal. No increase in right ventricular  wall thickness. Right ventricular systolic function is normal. Left Atrium: Left atrial size was normal in size. Right Atrium: Right atrial size was normal in size. Pericardium: There is no evidence of pericardial effusion. Mitral Valve: The mitral valve is normal in structure. No evidence of mitral valve regurgitation. No evidence of mitral valve stenosis. Tricuspid Valve: The tricuspid valve is normal in structure. Tricuspid valve regurgitation is not demonstrated. No evidence of tricuspid stenosis. Aortic Valve: The aortic valve is tricuspid. There is moderate calcification of the aortic valve. There is moderate thickening of the aortic valve. Aortic valve regurgitation is mild. Aortic valve sclerosis is present, with no evidence of aortic valve stenosis. Aortic valve mean gradient measures 6.0 mmHg. Aortic valve peak gradient measures 13.1 mmHg. Aortic valve area, by VTI measures 2.00 cm. Pulmonic Valve: The pulmonic valve was normal in structure. Pulmonic valve regurgitation is not visualized. No evidence of pulmonic stenosis. Aorta: The aortic root is normal in size and structure. Venous: The inferior vena cava is normal in size with greater than 50% respiratory variability, suggesting right atrial pressure of 3 mmHg. IAS/Shunts: No atrial level shunt detected by color flow Doppler. Agitated saline contrast was given intravenously to evaluate for intracardiac shunting. Agitated saline contrast bubble study was negative, with no evidence of any interatrial shunt. There  is no evidence of a patent foramen ovale. There is no evidence of an atrial septal defect.  LEFT VENTRICLE PLAX 2D LVIDd:         4.30 cm     Diastology LVIDs:         2.90 cm     LV e' medial:    7.40 cm/s LV PW:         0.80 cm     LV E/e' medial:  16.8 LV IVS:        0.80 cm     LV e' lateral:   8.59 cm/s LVOT diam:      1.70 cm     LV E/e' lateral: 14.4 LV SV:         73 LV SV Index:   41 LVOT Area:     2.27 cm  LV Volumes (MOD) LV vol d, MOD A4C: 75.3 ml LV vol s, MOD A4C: 24.1 ml LV SV MOD A4C:     75.3 ml RIGHT VENTRICLE RV S prime:     13.80 cm/s TAPSE (M-mode): 2.7 cm LEFT ATRIUM             Index        RIGHT ATRIUM           Index LA Vol (A2C):   43.8 ml 24.90 ml/m  RA Area:     10.20 cm LA Vol (A4C):   34.8 ml 19.79 ml/m  RA Volume:   22.10 ml  12.57 ml/m LA Biplane Vol: 40.0 ml 22.74 ml/m  AORTIC VALVE AV Area (Vmax):    1.69 cm AV Area (Vmean):   1.76 cm AV Area (VTI):     2.00 cm AV Vmax:           181.00 cm/s AV Vmean:          111.000 cm/s AV VTI:            0.365 m AV Peak Grad:      13.1 mmHg AV Mean Grad:      6.0 mmHg LVOT Vmax:         135.00 cm/s LVOT Vmean:  86.300 cm/s LVOT VTI:          0.321 m LVOT/AV VTI ratio: 0.88  AORTA Ao Root diam: 2.80 cm Ao Asc diam:  2.90 cm MITRAL VALVE MV Area (PHT): 4.19 cm     SHUNTS MV Decel Time: 181 msec     Systemic VTI:  0.32 m MV E velocity: 124.00 cm/s  Systemic Diam: 1.70 cm MV A velocity: 133.00 cm/s MV E/A ratio:  0.93 Candee Furbish MD Electronically signed by Candee Furbish MD Signature Date/Time: 11/24/2022/10:59:46 AM    Final    CT ANGIO HEAD NECK W WO CM  Result Date: 11/23/2022 CLINICAL DATA:  Dizziness and vision changes EXAM: CT ANGIOGRAPHY HEAD AND NECK TECHNIQUE: Multidetector CT imaging of the head and neck was performed using the standard protocol during bolus administration of intravenous contrast. Multiplanar CT image reconstructions and MIPs were obtained to evaluate the vascular anatomy. Carotid stenosis measurements (when applicable) are obtained utilizing NASCET criteria, using the distal internal carotid diameter as the denominator. RADIATION DOSE REDUCTION: This exam was performed according to the departmental dose-optimization program which includes automated exposure control, adjustment of the mA and/or kV according to patient  size and/or use of iterative reconstruction technique. CONTRAST:  2mL OMNIPAQUE IOHEXOL 350 MG/ML SOLN COMPARISON:  None Available. FINDINGS: CTA NECK FINDINGS SKELETON: There is no bony spinal canal stenosis. No lytic or blastic lesion. OTHER NECK: Normal pharynx, larynx and major salivary glands. No cervical lymphadenopathy. Unremarkable thyroid gland. UPPER CHEST: No pneumothorax or pleural effusion. No nodules or masses. AORTIC ARCH: There is calcific atherosclerosis of the aortic arch. There is no aneurysm, dissection or hemodynamically significant stenosis of the visualized portion of the aorta. Normal variant aortic arch branching pattern with the brachiocephalic and left common carotid arteries sharing a common origin. The visualized proximal subclavian arteries are widely patent. RIGHT CAROTID SYSTEM: No dissection, occlusion or aneurysm. There is calcified atherosclerosis extending into the proximal ICA, resulting in less than 50% stenosis. LEFT CAROTID SYSTEM: No dissection, occlusion or aneurysm. Mild atherosclerotic calcification at the carotid bifurcation without hemodynamically significant stenosis. VERTEBRAL ARTERIES: Right dominant configuration. Both origins are clearly patent. There is no dissection, occlusion or flow-limiting stenosis to the skull base (V1-V3 segments). CTA HEAD FINDINGS POSTERIOR CIRCULATION: --Vertebral arteries: Normal V4 segments. --Inferior cerebellar arteries: Normal. --Basilar artery: Normal. --Superior cerebellar arteries: Normal. --Posterior cerebral arteries (PCA): Normal. ANTERIOR CIRCULATION: --Intracranial internal carotid arteries: Atherosclerotic calcification of the internal carotid arteries at the skull base without hemodynamically significant stenosis. --Anterior cerebral arteries (ACA): Normal. Both A1 segments are present. Patent anterior communicating artery (a-comm). --Middle cerebral arteries (MCA): Normal. VENOUS SINUSES: As permitted by contrast timing,  patent. ANATOMIC VARIANTS: Fetal origins of both posterior cerebral arteries. Review of the MIP images confirms the above findings. IMPRESSION: 1. No emergent large vessel occlusion or hemodynamically significant stenosis of the head or neck. 2. Bilateral carotid bifurcation atherosclerosis without hemodynamically significant stenosis. Aortic atherosclerosis (ICD10-I70.0). Electronically Signed   By: Ulyses Jarred M.D.   On: 11/23/2022 23:26   DG Chest 2 View  Result Date: 11/23/2022 CLINICAL DATA:  CTA EXAM: CHEST - 2 VIEW COMPARISON:  12/12/2014 FINDINGS: Heart and mediastinal contours are within normal limits. No focal opacities or effusions. No acute bony abnormality. IMPRESSION: No active cardiopulmonary disease. Electronically Signed   By: Rolm Baptise M.D.   On: 11/23/2022 22:25   MR BRAIN WO CONTRAST  Result Date: 11/23/2022 CLINICAL DATA:  Transient ischemic attack.  Vision changes. EXAM: MRI HEAD  WITHOUT CONTRAST TECHNIQUE: Multiplanar, multiecho pulse sequences of the brain and surrounding structures were obtained without intravenous contrast. COMPARISON:  12/27/2009 FINDINGS: Brain: Small focus of acute ischemia at the right temporal/occipital junction. No acute hemorrhage. No chronic microhemorrhage or siderosis. There is multifocal hyperintense T2-weighted signal within the white matter. Generalized volume loss. Old left basal ganglia small vessel infarct. The midline structures are normal. Vascular: Normal flow voids. Skull and upper cervical spine: Normal marrow signal. Sinuses/Orbits: Small amount of left mastoid fluid.  Normal orbits. Other: None IMPRESSION: 1. Small focus of acute ischemia at the right temporal/occipital junction. No hemorrhage or mass effect. 2. Old left basal ganglia small vessel infarct and findings of chronic microvascular ischemia. Electronically Signed   By: Ulyses Jarred M.D.   On: 11/23/2022 20:25   CT Head Wo Contrast  Result Date: 11/23/2022 CLINICAL DATA:   Transient ischemic attack. EXAM: CT HEAD WITHOUT CONTRAST TECHNIQUE: Contiguous axial images were obtained from the base of the skull through the vertex without intravenous contrast. RADIATION DOSE REDUCTION: This exam was performed according to the departmental dose-optimization program which includes automated exposure control, adjustment of the mA and/or kV according to patient size and/or use of iterative reconstruction technique. COMPARISON:  December 22, 2012 FINDINGS: Brain: There is mild cerebral atrophy with widening of the extra-axial spaces and ventricular dilatation. There are areas of decreased attenuation within the white matter tracts of the supratentorial brain, consistent with microvascular disease changes. A small, chronic left basal ganglia lacunar infarct is noted. Vascular: There is marked severity bilateral cavernous carotid artery calcification. Skull: Normal. Negative for fracture or focal lesion. Sinuses/Orbits: No acute finding. Other: None. IMPRESSION: 1. No acute intracranial abnormality. 2. Generalized cerebral atrophy with widening of the extra-axial spaces and ventricular dilatation. 3. Small, chronic left basal ganglia lacunar infarct. Electronically Signed   By: Virgina Norfolk M.D.   On: 11/23/2022 18:52     TODAY-DAY OF DISCHARGE:  Subjective:   Paula Moore today has no headache,no chest abdominal pain,no new weakness tingling or numbness, feels much better wants to go home today.   Objective:   Blood pressure (!) 141/75, pulse 68, temperature 98 F (36.7 C), temperature source Oral, resp. rate 18, height 5\' 1"  (1.549 m), weight 76.7 kg, SpO2 100 %.  Intake/Output Summary (Last 24 hours) at 11/24/2022 1114 Last data filed at 11/24/2022 1000 Gross per 24 hour  Intake 1778.46 ml  Output --  Net 1778.46 ml   Filed Weights   11/23/22 1745  Weight: 76.7 kg    Exam: Awake Alert, Oriented *3, No new F.N deficits, Normal affect [place].AT,PERRAL Supple Neck,No JVD,  No cervical lymphadenopathy appriciated.  Symmetrical Chest wall movement, Good air movement bilaterally, CTAB RRR,No Gallops,Rubs or new Murmurs, No Parasternal Heave +ve B.Sounds, Abd Soft, Non tender, No organomegaly appriciated, No rebound -guarding or rigidity. No Cyanosis, Clubbing or edema, No new Rash or bruise   PERTINENT RADIOLOGIC STUDIES: ECHOCARDIOGRAM COMPLETE BUBBLE STUDY  Result Date: 11/24/2022    ECHOCARDIOGRAM REPORT   Patient Name:   Paula Moore Date of Exam: 11/24/2022 Medical Rec #:  [id number]     Height:       61.0 in Accession #:    [id number]    Weight:       169.1 lb Date of Birth:  25-Mar-1946     BSA:          1.759 m Patient Age:    4 years      BP:  148/77 mmHg Patient Gender: F             HR:           67 bpm. Exam Location:  Inpatient Procedure: Saline Contrast Bubble Study, 2D Echo, Cardiac Doppler and Color            Doppler Indications:    Stroke  History:        Patient has prior history of Echocardiogram examinations, most                 recent 12/13/2014. Signs/Symptoms:Chest Pain; Risk                 Factors:Hypertension, Diabetes and High Cholesterol.  Sonographer:    Marella Chimes Referring Phys: Myles Rosenthal, A IMPRESSIONS  1. Left ventricular ejection fraction, by estimation, is 65 to 70%. The left ventricle has normal function. The left ventricle has no regional wall motion abnormalities. Left ventricular diastolic parameters are indeterminate.  2. Right ventricular systolic function is normal. The right ventricular size is normal.  3. The mitral valve is normal in structure. No evidence of mitral valve regurgitation. No evidence of mitral stenosis.  4. The aortic valve is tricuspid. There is moderate calcification of the aortic valve. There is moderate thickening of the aortic valve. Aortic valve regurgitation is mild. Aortic valve sclerosis is present, with no evidence of aortic valve stenosis. Aortic valve mean gradient measures 6.0 mmHg.  Aortic valve Vmax measures 1.81 m/s.  5. The inferior vena cava is normal in size with greater than 50% respiratory variability, suggesting right atrial pressure of 3 mmHg.  6. Agitated saline contrast bubble study was negative, with no evidence of any interatrial shunt. Conclusion(s)/Recommendation(s): No intracardiac source of embolism detected on this transthoracic study. Consider a transesophageal echocardiogram to exclude cardiac source of embolism if clinically indicated. FINDINGS  Left Ventricle: Left ventricular ejection fraction, by estimation, is 65 to 70%. The left ventricle has normal function. The left ventricle has no regional wall motion abnormalities. The left ventricular internal cavity size was normal in size. There is  no left ventricular hypertrophy. Left ventricular diastolic parameters are indeterminate. Right Ventricle: The right ventricular size is normal. No increase in right ventricular wall thickness. Right ventricular systolic function is normal. Left Atrium: Left atrial size was normal in size. Right Atrium: Right atrial size was normal in size. Pericardium: There is no evidence of pericardial effusion. Mitral Valve: The mitral valve is normal in structure. No evidence of mitral valve regurgitation. No evidence of mitral valve stenosis. Tricuspid Valve: The tricuspid valve is normal in structure. Tricuspid valve regurgitation is not demonstrated. No evidence of tricuspid stenosis. Aortic Valve: The aortic valve is tricuspid. There is moderate calcification of the aortic valve. There is moderate thickening of the aortic valve. Aortic valve regurgitation is mild. Aortic valve sclerosis is present, with no evidence of aortic valve stenosis. Aortic valve mean gradient measures 6.0 mmHg. Aortic valve peak gradient measures 13.1 mmHg. Aortic valve area, by VTI measures 2.00 cm. Pulmonic Valve: The pulmonic valve was normal in structure. Pulmonic valve regurgitation is not visualized. No  evidence of pulmonic stenosis. Aorta: The aortic root is normal in size and structure. Venous: The inferior vena cava is normal in size with greater than 50% respiratory variability, suggesting right atrial pressure of 3 mmHg. IAS/Shunts: No atrial level shunt detected by color flow Doppler. Agitated saline contrast was given intravenously to evaluate for intracardiac shunting. Agitated saline contrast bubble study was  negative, with no evidence of any interatrial shunt. There  is no evidence of a patent foramen ovale. There is no evidence of an atrial septal defect.  LEFT VENTRICLE PLAX 2D LVIDd:         4.30 cm     Diastology LVIDs:         2.90 cm     LV e' medial:    7.40 cm/s LV PW:         0.80 cm     LV E/e' medial:  16.8 LV IVS:        0.80 cm     LV e' lateral:   8.59 cm/s LVOT diam:     1.70 cm     LV E/e' lateral: 14.4 LV SV:         73 LV SV Index:   43 LVOT Area:     2.27 cm  LV Volumes (MOD) LV vol d, MOD A4C: 75.3 ml LV vol s, MOD A4C: 24.1 ml LV SV MOD A4C:     75.3 ml RIGHT VENTRICLE RV S prime:     13.80 cm/s TAPSE (M-mode): 2.7 cm LEFT ATRIUM             Index        RIGHT ATRIUM           Index LA Vol (A2C):   43.8 ml 24.90 ml/m  RA Area:     10.20 cm LA Vol (A4C):   34.8 ml 19.79 ml/m  RA Volume:   22.10 ml  12.57 ml/m LA Biplane Vol: 40.0 ml 22.74 ml/m  AORTIC VALVE AV Area (Vmax):    1.69 cm AV Area (Vmean):   1.76 cm AV Area (VTI):     2.00 cm AV Vmax:           181.00 cm/s AV Vmean:          111.000 cm/s AV VTI:            0.365 m AV Peak Grad:      13.1 mmHg AV Mean Grad:      6.0 mmHg LVOT Vmax:         135.00 cm/s LVOT Vmean:        86.300 cm/s LVOT VTI:          0.321 m LVOT/AV VTI ratio: 0.88  AORTA Ao Root diam: 2.80 cm Ao Asc diam:  2.90 cm MITRAL VALVE MV Area (PHT): 4.19 cm     SHUNTS MV Decel Time: 181 msec     Systemic VTI:  0.32 m MV E velocity: 124.00 cm/s  Systemic Diam: 1.70 cm MV A velocity: 133.00 cm/s MV E/A ratio:  0.93 Candee Furbish MD Electronically signed by  Candee Furbish MD Signature Date/Time: 11/24/2022/10:59:46 AM    Final    CT ANGIO HEAD NECK W WO CM  Result Date: 11/23/2022 CLINICAL DATA:  Dizziness and vision changes EXAM: CT ANGIOGRAPHY HEAD AND NECK TECHNIQUE: Multidetector CT imaging of the head and neck was performed using the standard protocol during bolus administration of intravenous contrast. Multiplanar CT image reconstructions and MIPs were obtained to evaluate the vascular anatomy. Carotid stenosis measurements (when applicable) are obtained utilizing NASCET criteria, using the distal internal carotid diameter as the denominator. RADIATION DOSE REDUCTION: This exam was performed according to the departmental dose-optimization program which includes automated exposure control, adjustment of the mA and/or kV according to patient size and/or use of iterative reconstruction technique. CONTRAST:  64mL  OMNIPAQUE IOHEXOL 350 MG/ML SOLN COMPARISON:  None Available. FINDINGS: CTA NECK FINDINGS SKELETON: There is no bony spinal canal stenosis. No lytic or blastic lesion. OTHER NECK: Normal pharynx, larynx and major salivary glands. No cervical lymphadenopathy. Unremarkable thyroid gland. UPPER CHEST: No pneumothorax or pleural effusion. No nodules or masses. AORTIC ARCH: There is calcific atherosclerosis of the aortic arch. There is no aneurysm, dissection or hemodynamically significant stenosis of the visualized portion of the aorta. Normal variant aortic arch branching pattern with the brachiocephalic and left common carotid arteries sharing a common origin. The visualized proximal subclavian arteries are widely patent. RIGHT CAROTID SYSTEM: No dissection, occlusion or aneurysm. There is calcified atherosclerosis extending into the proximal ICA, resulting in less than 50% stenosis. LEFT CAROTID SYSTEM: No dissection, occlusion or aneurysm. Mild atherosclerotic calcification at the carotid bifurcation without hemodynamically significant stenosis. VERTEBRAL  ARTERIES: Right dominant configuration. Both origins are clearly patent. There is no dissection, occlusion or flow-limiting stenosis to the skull base (V1-V3 segments). CTA HEAD FINDINGS POSTERIOR CIRCULATION: --Vertebral arteries: Normal V4 segments. --Inferior cerebellar arteries: Normal. --Basilar artery: Normal. --Superior cerebellar arteries: Normal. --Posterior cerebral arteries (PCA): Normal. ANTERIOR CIRCULATION: --Intracranial internal carotid arteries: Atherosclerotic calcification of the internal carotid arteries at the skull base without hemodynamically significant stenosis. --Anterior cerebral arteries (ACA): Normal. Both A1 segments are present. Patent anterior communicating artery (a-comm). --Middle cerebral arteries (MCA): Normal. VENOUS SINUSES: As permitted by contrast timing, patent. ANATOMIC VARIANTS: Fetal origins of both posterior cerebral arteries. Review of the MIP images confirms the above findings. IMPRESSION: 1. No emergent large vessel occlusion or hemodynamically significant stenosis of the head or neck. 2. Bilateral carotid bifurcation atherosclerosis without hemodynamically significant stenosis. Aortic atherosclerosis (ICD10-I70.0). Electronically Signed   By: Ulyses Jarred M.D.   On: 11/23/2022 23:26   DG Chest 2 View  Result Date: 11/23/2022 CLINICAL DATA:  CTA EXAM: CHEST - 2 VIEW COMPARISON:  12/12/2014 FINDINGS: Heart and mediastinal contours are within normal limits. No focal opacities or effusions. No acute bony abnormality. IMPRESSION: No active cardiopulmonary disease. Electronically Signed   By: Rolm Baptise M.D.   On: 11/23/2022 22:25   MR BRAIN WO CONTRAST  Result Date: 11/23/2022 CLINICAL DATA:  Transient ischemic attack.  Vision changes. EXAM: MRI HEAD WITHOUT CONTRAST TECHNIQUE: Multiplanar, multiecho pulse sequences of the brain and surrounding structures were obtained without intravenous contrast. COMPARISON:  12/27/2009 FINDINGS: Brain: Small focus of acute  ischemia at the right temporal/occipital junction. No acute hemorrhage. No chronic microhemorrhage or siderosis. There is multifocal hyperintense T2-weighted signal within the white matter. Generalized volume loss. Old left basal ganglia small vessel infarct. The midline structures are normal. Vascular: Normal flow voids. Skull and upper cervical spine: Normal marrow signal. Sinuses/Orbits: Small amount of left mastoid fluid.  Normal orbits. Other: None IMPRESSION: 1. Small focus of acute ischemia at the right temporal/occipital junction. No hemorrhage or mass effect. 2. Old left basal ganglia small vessel infarct and findings of chronic microvascular ischemia. Electronically Signed   By: Ulyses Jarred M.D.   On: 11/23/2022 20:25   CT Head Wo Contrast  Result Date: 11/23/2022 CLINICAL DATA:  Transient ischemic attack. EXAM: CT HEAD WITHOUT CONTRAST TECHNIQUE: Contiguous axial images were obtained from the base of the skull through the vertex without intravenous contrast. RADIATION DOSE REDUCTION: This exam was performed according to the departmental dose-optimization program which includes automated exposure control, adjustment of the mA and/or kV according to patient size and/or use of iterative reconstruction technique. COMPARISON:  December 22, 2012 FINDINGS: Brain: There is mild cerebral atrophy with widening of the extra-axial spaces and ventricular dilatation. There are areas of decreased attenuation within the white matter tracts of the supratentorial brain, consistent with microvascular disease changes. A small, chronic left basal ganglia lacunar infarct is noted. Vascular: There is marked severity bilateral cavernous carotid artery calcification. Skull: Normal. Negative for fracture or focal lesion. Sinuses/Orbits: No acute finding. Other: None. IMPRESSION: 1. No acute intracranial abnormality. 2. Generalized cerebral atrophy with widening of the extra-axial spaces and ventricular dilatation. 3. Small,  chronic left basal ganglia lacunar infarct. Electronically Signed   By: Virgina Norfolk M.D.   On: 11/23/2022 18:52     PERTINENT LAB RESULTS: CBC: Recent Labs    11/23/22 1754  WBC 5.4  HGB 14.1  HCT 43.4  PLT 297   CMET CMP     Component Value Date/Time   NA 139 11/23/2022 1754   K 3.6 11/23/2022 1754   CL 105 11/23/2022 1754   CO2 27 11/23/2022 1754   GLUCOSE 105 (H) 11/23/2022 1754   BUN 12 11/23/2022 1754   CREATININE 1.07 (H) 11/23/2022 1754   CALCIUM 9.4 11/23/2022 1754   PROT 7.6 11/23/2022 1754   ALBUMIN 3.7 11/23/2022 1754   AST 21 11/23/2022 1754   ALT 15 11/23/2022 1754   ALKPHOS 72 11/23/2022 1754   BILITOT 0.5 11/23/2022 1754   GFRNONAA 55 (L) 11/23/2022 1754   GFRAA 87 (L) 12/13/2014 0340    GFR Estimated Creatinine Clearance: 43.3 mL/min (A) (by C-G formula based on SCr of 1.07 mg/dL (H)). No results for input(s): "LIPASE", "AMYLASE" in the last 72 hours. No results for input(s): "CKTOTAL", "CKMB", "CKMBINDEX", "TROPONINI" in the last 72 hours. Invalid input(s): "POCBNP" No results for input(s): "DDIMER" in the last 72 hours. No results for input(s): "HGBA1C" in the last 72 hours. Recent Labs    11/24/22 0442  CHOL 176  HDL 45  LDLCALC 116*  TRIG 75  CHOLHDL 3.9   No results for input(s): "TSH", "T4TOTAL", "T3FREE", "THYROIDAB" in the last 72 hours.  Invalid input(s): "FREET3" No results for input(s): "VITAMINB12", "FOLATE", "FERRITIN", "TIBC", "IRON", "RETICCTPCT" in the last 72 hours. Coags: Recent Labs    11/23/22 2027  INR 1.0   Microbiology: Recent Results (from the past 240 hour(s))  Resp panel by RT-PCR (RSV, Flu A&B, Covid) Anterior Nasal Swab     Status: None   Collection Time: 11/23/22  7:27 PM   Specimen: Anterior Nasal Swab  Result Value Ref Range Status   SARS Coronavirus 2 by RT PCR NEGATIVE NEGATIVE Final   Influenza A by PCR NEGATIVE NEGATIVE Final   Influenza B by PCR NEGATIVE NEGATIVE Final    Comment:  (NOTE) The Xpert Xpress SARS-CoV-2/FLU/RSV plus assay is intended as an aid in the diagnosis of influenza from Nasopharyngeal swab specimens and should not be used as a sole basis for treatment. Nasal washings and aspirates are unacceptable for Xpert Xpress SARS-CoV-2/FLU/RSV testing.  Fact Sheet for Patients: EntrepreneurPulse.com.au  Fact Sheet for Healthcare Providers: IncredibleEmployment.be  This test is not yet approved or cleared by the [place] FDA and has been authorized for detection and/or diagnosis of SARS-CoV-2 by FDA under an Emergency Use Authorization (EUA). This EUA will remain in effect (meaning this test can be used) for the duration of the COVID-19 declaration under Section 564(b)(1) of the Act, 21 U.S.C. section 360bbb-3(b)(1), unless the authorization is terminated or revoked.     Resp Syncytial Virus by PCR  NEGATIVE NEGATIVE Final    Comment: (NOTE) Fact Sheet for Patients: EntrepreneurPulse.com.au  Fact Sheet for Healthcare Providers: IncredibleEmployment.be  This test is not yet approved or cleared by the [place] FDA and has been authorized for detection and/or diagnosis of SARS-CoV-2 by FDA under an Emergency Use Authorization (EUA). This EUA will remain in effect (meaning this test can be used) for the duration of the COVID-19 declaration under Section 564(b)(1) of the Act, 21 U.S.C. section 360bbb-3(b)(1), unless the authorization is terminated or revoked.  Performed at [facility], [place] [address] [place], [place] [address]     FURTHER DISCHARGE INSTRUCTIONS:  Get Medicines reviewed and adjusted: Please take all your medications with you for your next visit with your Primary MD  Laboratory/radiological data: Please request your Primary MD to go over all hospital tests and procedure/radiological results at the follow up, please ask your Primary MD to get  all Hospital records sent to his/her office.  In some cases, they will be blood work, cultures and biopsy results pending at the time of your discharge. Please request that your primary care M.D. goes through all the records of your hospital data and follows up on these results.  Also Note the following: If you experience worsening of your admission symptoms, develop shortness of breath, life threatening emergency, suicidal or homicidal thoughts you must seek medical attention immediately by calling 911 or calling your MD immediately  if symptoms less severe.  You must read complete instructions/literature along with all the possible adverse reactions/side effects for all the Medicines you take and that have been prescribed to you. Take any new Medicines after you have completely understood and accpet all the possible adverse reactions/side effects.   Do not drive when taking Pain medications or sleeping medications (Benzodaizepines)  Do not take more than prescribed Pain, Sleep and Anxiety Medications. It is not advisable to combine anxiety,sleep and pain medications without talking with your primary care practitioner  Special Instructions: If you have smoked or chewed Tobacco  in the last 2 yrs please stop smoking, stop any regular Alcohol  and or any Recreational drug use.  Wear Seat belts while driving.  Please note: You were cared for by a hospitalist during your hospital stay. Once you are discharged, your primary care physician will handle any further medical issues. Please note that NO REFILLS for any discharge medications will be authorized once you are discharged, as it is imperative that you return to your primary care physician (or establish a relationship with a primary care physician if you do not have one) for your post hospital discharge needs so that they can reassess your need for medications and monitor your lab values.  Total Time spent coordinating discharge including  counseling, education and face to face time equals greater than 30 minutes.  SignedOren Binet 11/24/2022 11:14 AM

## 2022-11-24 NOTE — Progress Notes (Signed)
  Echocardiogram 2D Echocardiogram has been performed.  [name] 11/24/2022, 10:29 AM

## 2022-11-24 NOTE — Care Management CC44 (Signed)
Condition Code 44 Documentation Completed  Patient Details  Name: Paula Moore MRN: [id number] Date of Birth: 1946/08/17   Condition Code 44 given:  Yes Patient signature on Condition Code 44 notice:  Yes Documentation of 2 MD's agreement:  Yes Code 44 added to claim:  Yes    Carles Collet, RN 11/24/2022, 10:30 AM

## 2022-11-24 NOTE — Care Management Obs Status (Signed)
[place] NOTIFICATION   Patient Details  Name: Ebba Kloos MRN: [id number] Date of Birth: [birth year]   Medicare Observation Status Notification Given:  Yes    Carles Collet, RN 11/24/2022, 10:30 AM

## 2022-11-24 NOTE — Evaluation (Signed)
Occupational Therapy Evaluation Patient Details Name: Paula Moore MRN: [id number] DOB: [birth year] Today's Date: 11/24/2022   History of Present Illness Pt is a 77 y/o F presenting to ED on 3/16 with LUE weakness and vision changes, MRI brain revealing small focus of acute ischemia in R temporal/occipital junction. PMH includes DM, HTN, hypercholesteremia.   Clinical Impression   Pt reports independence at baseline with ADLs and functional mobility, lives with spouse and grandchildren who can assist at d/c. Pt currently mod I - supervision for ADLs, and supervision for transfers without AD. Pt reports vision deficits have resolved, appears WFL/baseline. Pt with mild LUE weakness, compared to baseline, but uses LUE functionally for ADLs/mobility. Pt presenting with impairments listed below, will follow acutely. Recommend OP OT at d/c.       Recommendations for follow up therapy are one component of a multi-disciplinary discharge planning process, led by the attending physician.  Recommendations may be updated based on patient status, additional functional criteria and insurance authorization.   Follow Up Recommendations  Outpatient OT     Assistance Recommended at Discharge Set up Supervision/Assistance  Patient can return home with the following A little help with bathing/dressing/bathroom;Assist for transportation;Assistance with cooking/housework    Functional Status Assessment  Patient has had a recent decline in their functional status and demonstrates the ability to make significant improvements in function in a reasonable and predictable amount of time.  Equipment Recommendations  None recommended by OT    Recommendations for Other Services       Precautions / Restrictions Precautions Precautions: Fall Restrictions Weight Bearing Restrictions: No      Mobility Bed Mobility               General bed mobility comments: seated EOB upon arrival and departure     Transfers Overall transfer level: Needs assistance Equipment used: None Transfers: Sit to/from Stand Sit to Stand: Supervision                  Balance Overall balance assessment: Mild deficits observed, not formally tested                                         ADL either performed or assessed with clinical judgement   ADL Overall ADL's : Needs assistance/impaired Eating/Feeding: Modified independent   Grooming: Supervision/safety   Upper Body Bathing: Supervision/ safety   Lower Body Bathing: Supervison/ safety   Upper Body Dressing : Supervision/safety   Lower Body Dressing: Supervision/safety   Toilet Transfer: Supervision/safety   Toileting- Clothing Manipulation and Hygiene: Supervision/safety       Functional mobility during ADLs: Supervision/safety       Vision Baseline Vision/History: 1 Wears glasses Patient Visual Report: No change from baseline Vision Assessment?: Yes Eye Alignment: Within Functional Limits Ocular Range of Motion: Within Functional Limits Alignment/Gaze Preference: Within Defined Limits Tracking/Visual Pursuits: Able to track stimulus in all quads without difficulty Convergence: Within functional limits Visual Fields: No apparent deficits Additional Comments: pt reports wearing glasses for distance only     Perception Perception Perception Tested?: No   Praxis Praxis Praxis tested?: Within functional limits    Pertinent Vitals/Pain Pain Assessment Pain Assessment: No/denies pain     Hand Dominance Right   Extremity/Trunk Assessment Upper Extremity Assessment Upper Extremity Assessment: LUE deficits/detail LUE Coordination: decreased fine motor   Lower Extremity Assessment Lower Extremity Assessment: Overall WFL for  tasks assessed (reports LLE "tightness")   Cervical / Trunk Assessment Cervical / Trunk Assessment: Normal   Communication Communication Communication: No difficulties    Cognition Arousal/Alertness: Awake/alert Behavior During Therapy: WFL for tasks assessed/performed Overall Cognitive Status: Within Functional Limits for tasks assessed                                 General Comments: able to recall events leading to admission and provide PLOF history, good awareness of current deficits     General Comments  VSS on RA    Exercises     Shoulder Instructions      Home Living Family/patient expects to be discharged to:: Private residence Living Arrangements: Spouse/significant other;Other relatives (2 granddaughters (~71 years old)) Available Help at Discharge: Family;Available 24 hours/day Type of Home: House Home Access: Stairs to enter [employer] of Steps: 3   Home Layout: Two level;Bed/bath upstairs Alternate Level Stairs-Number of Steps: flight   Bathroom Shower/Tub: Tub/shower unit         Home Equipment: None          Prior Functioning/Environment Prior Level of Function : Independent/Modified Independent             Mobility Comments: ind ADLs Comments: works in Chemical engineer at [employer]        OT Problem List: Decreased strength;Decreased range of motion      OT Treatment/Interventions: Therapeutic exercise;Self-care/ADL training;DME and/or AE instruction;Energy conservation;Therapeutic activities;Balance training;Patient/family education    OT Goals(Current goals can be found in the care plan section) Acute Rehab OT Goals Patient Stated Goal: none stated OT Goal Formulation: With patient Time For Goal Achievement: 12/08/22 Potential to Achieve Goals: Good ADL Goals Pt Will Perform Tub/Shower Transfer: Tub transfer;Shower transfer;Independently;ambulating Pt/caregiver will Perform Home Exercise Program: Left upper extremity;Increased ROM;Increased strength  OT Frequency: Min 2X/week    Co-evaluation              AM-PAC OT "6 Clicks" Daily Activity     Outcome Measure  Help from another person eating meals?: None Help from another person taking care of personal grooming?: None Help from another person toileting, which includes using toliet, bedpan, or urinal?: A Little Help from another person bathing (including washing, rinsing, drying)?: A Little Help from another person to put on and taking off regular upper body clothing?: A Little Help from another person to put on and taking off regular lower body clothing?: A Little 6 Click Score: 20   End of Session Nurse Communication: Mobility status  Activity Tolerance: Patient tolerated treatment well Patient left: in bed;with call bell/phone within reach  OT Visit Diagnosis: Muscle weakness (generalized) (M62.81)                Time: [id number] OT Time Calculation (min): 21 min Charges:  OT General Charges $OT Visit: 1 Visit OT Evaluation $OT Eval Low Complexity: 1 Low  Shane Melby K, OTD, OTR/L SecureChat Preferred Acute Rehab [phone]   Keyera Hattabaugh K Koonce 11/24/2022, 8:30 AM

## 2022-11-24 NOTE — TOC Transition Note (Signed)
Transition of Care [facility]) - CM/SW Discharge Note   Patient Details  Name: Paula Moore MRN: [id number] Date of Birth: 1946-08-23  [facility] CM/SW Contact:  Carles Collet, RN Phone Number: 11/24/2022, 11:40 AM   Clinical Narrative:     Referral [id number] made to [address] for OP PT OT.   NO other TOC needs identified for DC  Final next level of care: Home/Self Care Barriers to Discharge: No Barriers Identified   Patient Goals and CMS Choice      Discharge Placement                         Discharge Plan and Services Additional resources added to the After Visit Summary for                                       Social Determinants of Health (SDOH) Interventions SDOH Screenings   Food Insecurity: No Food Insecurity (11/23/2022)  Housing: [place]  (11/23/2022)  Transportation Needs: No Transportation Needs (11/23/2022)  Utilities: Not At Risk (11/23/2022)  Tobacco Use: Low Risk  (11/23/2022)     Readmission Risk Interventions     No data to display

## 2022-11-24 NOTE — Progress Notes (Signed)
Unassigned. DOD to read. Richardson Dopp, PA-C    11/24/2022 10:16 AM

## 2022-11-24 NOTE — Evaluation (Signed)
Physical Therapy Evaluation Patient Details Name: Paula Moore MRN: [id number] DOB: 04-11-1946 Today's Date: 11/24/2022  History of Present Illness  Pt is a 77 y/o F presenting to ED on 3/16 with LUE weakness and vision changes, MRI brain revealing small focus of acute ischemia in R temporal/occipital junction. PMH includes DM, HTN, hypercholesteremia.   Clinical Impression  Paula Moore is 77 y.o. female admitted with above HPI and diagnosis. Patient is currently limited by functional impairments below (see PT problem list). Patient lives with spouse and grandchildren and is independent at baseline. Patient evaluated by Physical Therapy with no further acute PT needs identified. All education has been completed and the patient has no further questions. Pt ambulated >300' with use of IV pole at supervision level. Discussed benefits of OPPT follow up with pt. See below for any follow-up Physical Therapy or equipment needs. PT is signing off. Thank you for this referral.        Recommendations for follow up therapy are one component of a multi-disciplinary discharge planning process, led by the attending physician.  Recommendations may be updated based on patient status, additional functional criteria and insurance authorization.  Follow Up Recommendations Outpatient PT      Assistance Recommended at Discharge PRN  Patient can return home with the following  Assistance with cooking/housework;Assist for transportation;Help with stairs or ramp for entrance    Equipment Recommendations None recommended by PT  Recommendations for Other Services       Functional Status Assessment Patient has had a recent decline in their functional status and demonstrates the ability to make significant improvements in function in a reasonable and predictable amount of time.     Precautions / Restrictions Precautions Precautions: Fall Restrictions Weight Bearing Restrictions: No      Mobility  Bed  Mobility Overal bed mobility: Modified Independent             General bed mobility comments: HOB slightly elevated, no assist needed for supine>sit    Transfers Overall transfer level: Needs assistance Equipment used: None Transfers: Sit to/from Stand Sit to Stand: Supervision           General transfer comment: use of hands to rise and lower, sup for safety    Ambulation/Gait Ambulation/Gait assistance: Supervision Gait Distance (Feet): 350 Feet Assistive device: IV Pole Gait Pattern/deviations: Step-through pattern, Decreased stride length Gait velocity: decr     General Gait Details: Patient with smooth even step pattern, slightly shortened and cautious pace. no LOB throughout. Cues at start for management of IV, pt steady with IV during gait and turns. VSS.  Stairs            Wheelchair Mobility    Modified Rankin (Stroke Patients Only)       Balance Overall balance assessment: Mild deficits observed, not formally tested                                           Pertinent Vitals/Pain Pain Assessment Pain Assessment: No/denies pain    Home Living Family/patient expects to be discharged to:: Private residence Living Arrangements: Spouse/significant other;Other relatives (2 granddaughters (~23 years old)) Available Help at Discharge: Family;Available 24 hours/day Type of Home: House Home Access: Stairs to enter   Entrance Stairs-Number of Steps: 3 Alternate Level Stairs-Number of Steps: flight Home Layout: Two level;Bed/bath upstairs Home Equipment: None      Prior  Function Prior Level of Function : Independent/Modified Independent             Mobility Comments: ind ADLs Comments: works in Chemical engineer at [employer]   Dominant Hand: Right    Extremity/Trunk Assessment   Upper Extremity Assessment Upper Extremity Assessment: Defer to OT evaluation LUE Coordination: decreased fine  motor    Lower Extremity Assessment Lower Extremity Assessment: RLE deficits/detail;LLE deficits/detail RLE Deficits / Details: 4+/5 throughoout RLE Sensation: WNL RLE Coordination: WNL LLE Deficits / Details: 4+/5 throughoout LLE Sensation: WNL LLE Coordination: WNL    Cervical / Trunk Assessment Cervical / Trunk Assessment: Normal  Communication   Communication: No difficulties  Cognition Arousal/Alertness: Awake/alert Behavior During Therapy: WFL for tasks assessed/performed Overall Cognitive Status: Within Functional Limits for tasks assessed                                 General Comments: able to recall events leading to admission and provide PLOF history, good awareness of current deficits        General Comments General comments (skin integrity, edema, etc.): VSS on RA    Exercises     Assessment/Plan    PT Assessment All further PT needs can be met in the next venue of care  PT Problem List Decreased balance;Decreased activity tolerance       PT Treatment Interventions DME instruction;Patient/family education;Cognitive remediation;Neuromuscular re-education;Balance training;Therapeutic exercise;Therapeutic activities;Functional mobility training;Stair training;Gait training    PT Goals (Current goals can be found in the Care Plan section)  Acute Rehab PT Goals Patient Stated Goal: return home and back to normal activities PT Goal Formulation: With patient Time For Goal Achievement: 12/01/22 Potential to Achieve Goals: Good    Frequency  (1x eval/treat)     Co-evaluation               AM-PAC PT "6 Clicks" Mobility  Outcome Measure Help needed turning from your back to your side while in a flat bed without using bedrails?: None Help needed moving from lying on your back to sitting on the side of a flat bed without using bedrails?: None Help needed moving to and from a bed to a chair (including a wheelchair)?: A Little Help needed  standing up from a chair using your arms (e.g., wheelchair or bedside chair)?: A Little Help needed to walk in hospital room?: A Little Help needed climbing 3-5 steps with a railing? : A Little 6 Click Score: 20    End of Session Equipment Utilized During Treatment: Gait belt Activity Tolerance: Patient tolerated treatment well Patient left: in chair;with call bell/phone within reach Nurse Communication: Mobility status PT Visit Diagnosis: Difficulty in walking, not elsewhere classified (R26.2);Other symptoms and signs involving the nervous system [id number])    Time: [id number] PT Time Calculation (min) (ACUTE ONLY): 16 min   Charges:   PT Evaluation $PT Eval Low Complexity: 1 Low          Verner Mould, DPT Acute Rehabilitation Services Office [phone]  11/24/22 10:27 AM

## 2022-11-24 NOTE — Progress Notes (Signed)
Patient discharged home with daughter. Patient denies complaints.

## 2022-11-24 NOTE — Evaluation (Signed)
Speech Language Pathology Evaluation Patient Details Name: Paula Moore MRN: [id number] DOB: Mar 15, 1946 Today's Date: 11/24/2022 Time: [id number] SLP Time Calculation (min) (ACUTE ONLY): 25 min  Problem List:  Patient Active Problem List   Diagnosis Date Noted   Acute CVA (cerebrovascular accident) ([place]) 11/23/2022   Chest pain 12/12/2014   Hypertensive urgency 12/12/2014   Hypokalemia 12/12/2014   High cholesterol    Diabetes mellitus without complication ([place])    Hypertension    Left-sided thoracic back pain    Past Medical History:  Past Medical History:  Diagnosis Date   Diabetes mellitus without complication ([place])    prediabetic   High cholesterol    Hypertension    Shingles    Past Surgical History:  Past Surgical History:  Procedure Laterality Date   GUM SURGERY  1978   HPI:  Pt is a 77 y/o female who presented on 3/16 with LUE weakness and vision changes. MRI brain: small focus of acute ischemia in R temporal/occipital junction. PMH: DM, HTN, hypercholesteremia.   Assessment / Plan / Recommendation Clinical Impression  Pt participated in speech-language-cognition evaluation evaluation. Pt reported that she has a bachelor's degree and currently works part time doing Press photographer for [employer]. She denied any baseline or acute deficits in speech, language or cognition. [facility] Mental Status Examination was completed to evaluate the pt's cognitive-linguistic skills. She achieved a score of 29/30 which is within the normal limits of 27 or more out of 30. No speech or language deficits were noted and her performance on informal cognitive-linguistic tasks was within functional limits. Further skilled SLP services are not clinically indicated at this time, but pt was advised to let her PCP know if she notices changes in cognitive skills when she returns to work. Pt verbalized understanding as well as agreement recommendations and with plan of care.    SLP  Assessment  SLP Recommendation/Assessment: Patient does not need any further Speech [place] [facility] SLP Visit Diagnosis: Cognitive communication deficit (R41.841)    Recommendations for follow up therapy are one component of a multi-disciplinary discharge planning process, led by the attending physician.  Recommendations may be updated based on patient status, additional functional criteria and insurance authorization.    Follow Up Recommendations  No SLP follow up    Assistance Recommended at Discharge  None  Functional Status Assessment Patient has not had a recent decline in their functional status  Frequency and Duration           SLP Evaluation Cognition  Overall Cognitive Status: Within Functional Limits for tasks assessed Arousal/Alertness: Awake/alert Orientation Level: Oriented X4 Year: 2024 Month: March Day of Week: Correct Attention: Focused;Sustained Focused Attention: Appears intact Sustained Attention: Appears intact Memory: Impaired Memory Impairment:  (Immediate: 5/5; delayed: 4/5; with cue: 1/1) Awareness: Appears intact Problem Solving: Appears intact (Money: 3/3; time:) Executive Function: Sequencing;Organizing Sequencing: Appears intact (clock: 4/4 with self-correction) Organizing: Appears intact (backward digit span: 3/3)       Comprehension  Auditory Comprehension Overall Auditory Comprehension: Appears within functional limits for tasks assessed Yes/No Questions: Within Functional Limits Commands: Within Functional Limits Conversation: Complex    Expression Expression Primary Mode of Expression: Verbal Verbal Expression Overall Verbal Expression: Appears within functional limits for tasks assessed Initiation: No impairment Level of Generative/Spontaneous Verbalization: Conversation Repetition: No impairment Naming: No impairment Written Expression Dominant Hand: Right   Oral / Motor  Oral Motor/Sensory Function Overall Oral  Motor/Sensory Function: Within functional limits Motor Speech Overall Motor Speech:  Appears within functional limits for tasks assessed Respiration: Within functional limits Phonation: Normal Resonance: Within functional limits Articulation: Within functional limitis Intelligibility: Intelligible Motor Planning: Witnin functional limits           Akasha Melena I. Hardin Negus, [place], [place] Office number [phone]  Horton Marshall 11/24/2022, 10:58 AM

## 2022-11-25 ENCOUNTER — Other Ambulatory Visit: Payer: Self-pay | Admitting: Physician Assistant

## 2022-11-25 DIAGNOSIS — R42 Dizziness and giddiness: Secondary | ICD-10-CM

## 2022-11-25 DIAGNOSIS — I639 Cerebral infarction, unspecified: Secondary | ICD-10-CM

## 2022-11-25 LAB — HEMOGLOBIN A1C
Hgb A1c MFr Bld: 6 % — ABNORMAL HIGH (ref 4.8–5.6)
Mean Plasma Glucose: 126 mg/dL

## 2022-11-29 ENCOUNTER — Ambulatory Visit: Payer: Medicare Other | Attending: Physician Assistant

## 2022-11-29 DIAGNOSIS — R42 Dizziness and giddiness: Secondary | ICD-10-CM

## 2022-11-29 DIAGNOSIS — I639 Cerebral infarction, unspecified: Secondary | ICD-10-CM | POA: Diagnosis not present

## 2022-12-25 ENCOUNTER — Encounter: Payer: Self-pay | Admitting: Adult Health

## 2022-12-25 ENCOUNTER — Ambulatory Visit: Payer: Medicare Other | Admitting: Adult Health

## 2022-12-25 VITALS — BP 120/61 | HR 58 | Ht 61.0 in | Wt 172.0 lb

## 2022-12-25 DIAGNOSIS — I639 Cerebral infarction, unspecified: Secondary | ICD-10-CM | POA: Diagnosis not present

## 2022-12-25 DIAGNOSIS — E7800 Pure hypercholesterolemia, unspecified: Secondary | ICD-10-CM

## 2022-12-25 NOTE — Progress Notes (Signed)
I agree with the above plan 

## 2022-12-25 NOTE — Progress Notes (Signed)
PATIENT: Paula Moore DOB: 12/11/1945  REASON FOR VISIT: follow up HISTORY FROM: patient PRIMARY NEUROLOGIST: Dr. Pearlean Brownie  Chief Complaint  Patient presents with   Follow-up    Pt in 8  Pt here for mini stroke f/u  Pt states wearing heart monitor for 30 days Pt states unaware who prescribed monitor . Pt states received in mail after d/c hospital Pt states was asked at hospital but Rexene Alberts which MD she was speaking with Pt has questions about continuation of Plavix and Crestor      HISTORY OF PRESENT ILLNESS: Today 12/25/22  Paula Moore is a 77 y.o. female who comes in today for hospital follow-up following a small acute right temporal occipital ischemic stroke .  She reports that she has not been left with any deficits.  She is now just on aspirin.  She is wearing a cardiac monitor.  She has not had any additional symptoms.  She has been evaluated by her primary care provider.  Currently on Crestor for her cholesterol.  Returns today for an evaluation.  HISTORY Ms. Paula Moore is a 77 y.o. female with history of pre-diabetes hypertension hyperlipidemia who presents to the emergency room for left-sided weakness and numbness vision changes, and dizziness.  MRI brain with small ischemic stroke   Stroke: small acute right temporal occipital ischemic stroke, possible small vessel disease vs. Cardioembolic source CT head No acute abnormality. Chronic left BG infarct CTA head & neck no LVO MRI small right temporal occipital ischemic stroke.  Left basal ganglia chronic infarct 2D Echo  EF 65 to 70% no shunt LDL 116 HgbA1c pending UDS neg VTE prophylaxis -hep SQ Recommend 30-day monitoring to rule out afib and if negative, may consider loop recorder placement No antithrombotic prior to admission, now on aspirin 81 mg daily and clopidogrel 75 mg daily for 3 weeks then aspirin alone therapy Therapy recommendations: Outpatient PT Disposition: home today  REVIEW OF SYSTEMS: Out of a  complete 14 system review of symptoms, the patient complains only of the following symptoms, and all other reviewed systems are negative.  ALLERGIES: Allergies  Allergen Reactions   Cozaar [Losartan Potassium]     Disorientation    Lisinopril     coughing    HOME MEDICATIONS: Outpatient Medications Prior to Visit  Medication Sig Dispense Refill   alendronate (FOSAMAX) 70 MG tablet Take 70 mg by mouth once a week. Thursday     amLODipine (NORVASC) 10 MG tablet Take 1 tablet (10 mg total) by mouth daily. 30 tablet 0   aspirin 81 MG chewable tablet Chew 1 tablet (81 mg total) by mouth daily. 30 tablet 11   clopidogrel (PLAVIX) 75 MG tablet Take 1 tablet (75 mg total) by mouth daily. Take along with aspirin for 3 weeks, after 3 weeks stop Plavix and continue on aspirin. 21 tablet 0   latanoprost (XALATAN) 0.005 % ophthalmic solution Place 1 drop into both eyes at bedtime.     Multiple Vitamins-Minerals (ALIVE ONCE DAILY WOMENS 50+) TABS Take 1 tablet by mouth daily.     OVER THE COUNTER MEDICATION Take 1 tablet by mouth daily. Vein Circulation supplement     rosuvastatin (CRESTOR) 20 MG tablet Take 1 tablet (20 mg total) by mouth daily. 30 tablet 11   UNABLE TO FIND daily at 2 PM. Med Name: Morning Ruth liquid multivitamin     Facility-Administered Medications Prior to Visit  Medication Dose Route Frequency Provider Last Rate Last Admin   0.9 %  sodium chloride infusion  500 mL Intravenous Once Iva Boop, MD        PAST MEDICAL HISTORY: Past Medical History:  Diagnosis Date   Diabetes mellitus without complication    prediabetic   High cholesterol    Hypertension    Shingles     PAST SURGICAL HISTORY: Past Surgical History:  Procedure Laterality Date   GUM SURGERY  1978    FAMILY HISTORY: Family History  Problem Relation Age of Onset   Heart failure Mother    Heart failure Father    Cancer Sister    Rectal cancer Brother        late 34's   Colon polyps Daughter     Colon cancer Neg Hx    Esophageal cancer Neg Hx    Stomach cancer Neg Hx    Stroke Neg Hx     SOCIAL HISTORY: Social History   Socioeconomic History   Marital status: Married    Spouse name: Not on file   Number of children: Not on file   Years of education: Not on file   Highest education level: Not on file  Occupational History   Not on file  Tobacco Use   Smoking status: Never   Smokeless tobacco: Never  Vaping Use   Vaping Use: Never used  Substance and Sexual Activity   Alcohol use: No   Drug use: No   Sexual activity: Not on file  Other Topics Concern   Not on file  Social History Narrative   Not on file   Social Determinants of Health   Financial Resource Strain: Not on file  Food Insecurity: No Food Insecurity (11/23/2022)   Hunger Vital Sign    Worried About Running Out of Food in the Last Year: Never true    Ran Out of Food in the Last Year: Never true  Transportation Needs: No Transportation Needs (11/23/2022)   PRAPARE - Administrator, Civil Service (Medical): No    Lack of Transportation (Non-Medical): No  Physical Activity: Not on file  Stress: Not on file  Social Connections: Not on file  Intimate Partner Violence: Not At Risk (11/23/2022)   Humiliation, Afraid, Rape, and Kick questionnaire    Fear of Current or Ex-Partner: No    Emotionally Abused: No    Physically Abused: No    Sexually Abused: No      PHYSICAL EXAM  Vitals:   12/25/22 0908  BP: 120/61  Pulse: (!) 58  Weight: 172 lb (78 kg)  Height:  (1.549 m)   Body mass index is 32.5 kg/m.  Generalized: Well developed, in no acute distress   Neurological examination  Mentation: Alert oriented to time, place, history taking. Follows all commands speech and language fluent Cranial nerve II-XII: Pupils were equal round reactive to light. Extraocular movements were full, visual field were full on confrontational test. Facial sensation and strength were normal.  Uvula tongue midline. Head turning and shoulder shrug  were normal and symmetric. Motor: The motor testing reveals 5 over 5 strength of all 4 extremities. Good symmetric motor tone is noted throughout.  Sensory: Sensory testing is intact to soft touch on all 4 extremities. No evidence of extinction is noted.  Coordination: Cerebellar testing reveals good finger-nose-finger and heel-to-shin bilaterally.  Gait and station: Gait is normal.  Reflexes: Deep tendon reflexes are symmetric and normal bilaterally.   DIAGNOSTIC DATA (LABS, IMAGING, TESTING) - I reviewed patient records, labs, notes, testing and imaging  myself where available.  Lab Results  Component Value Date   WBC 5.4 11/23/2022   HGB 14.1 11/23/2022   HCT 43.4 11/23/2022   MCV 87.3 11/23/2022   PLT 297 11/23/2022      Component Value Date/Time   NA 139 11/23/2022 1754   K 3.6 11/23/2022 1754   CL 105 11/23/2022 1754   CO2 27 11/23/2022 1754   GLUCOSE 105 (H) 11/23/2022 1754   BUN 12 11/23/2022 1754   CREATININE 1.07 (H) 11/23/2022 1754   CALCIUM 9.4 11/23/2022 1754   PROT 7.6 11/23/2022 1754   ALBUMIN 3.7 11/23/2022 1754   AST 21 11/23/2022 1754   ALT 15 11/23/2022 1754   ALKPHOS 72 11/23/2022 1754   BILITOT 0.5 11/23/2022 1754   GFRNONAA 55 (L) 11/23/2022 1754   GFRAA 87 (L) 12/13/2014 0340   Lab Results  Component Value Date   CHOL 176 11/24/2022   HDL 45 11/24/2022   LDLCALC 116 (H) 11/24/2022   TRIG 75 11/24/2022   CHOLHDL 3.9 11/24/2022   Lab Results  Component Value Date   HGBA1C 6.0 (H) 11/23/2022     ASSESSMENT AND PLAN 77 y.o. year old female  has a past medical history of Diabetes mellitus without complication, High cholesterol, Hypertension, and Shingles. here with:  Stroke: small acute right temporal occipital ischemic stroke, possible small vessel disease vs. Cardioembolic source  Continue aspirin 81 mg daily  for secondary stroke prevention.  Discussed secondary stroke prevention  measures and importance of close PCP follow up for aggressive stroke risk factor management. I have gone over the pathophysiology of stroke, warning signs and symptoms, risk factors and their management in some detail with instructions to go to the closest emergency room for symptoms of concern. HTN: BP goal <130/90.  HLD: LDL goal <70. Recent LDL 116.  DMII: A1c goal<7.0. Recent A1c 6.  Encouraged patient to monitor diet and encouraged exercise I did advise the patient that if her 30-day cardiac monitor was unremarkable she can call our office and we would refer for loop recorder FU with our office PRN      Butch Penny, MSN, NP-C 12/25/2022, 9:21 AM [facility] [address] [place], [place] [address] [phone]

## 2022-12-25 NOTE — Patient Instructions (Signed)
Your Plan:  Continue ASA  Blood pressure goal <130/90 Cholesterol LDL goal <70 Diabetes goal A1c <7 Monitor diet and try to exercise   Thank you for coming to see [place] at [facility]. I hope we have been able to provide you high quality care today.  You may receive a patient satisfaction survey over the next few weeks. We would appreciate your feedback and comments so that we may continue to improve ourselves and the health of our patients.

## 2023-01-04 ENCOUNTER — Encounter (HOSPITAL_COMMUNITY): Payer: Self-pay | Admitting: Emergency Medicine

## 2023-01-04 ENCOUNTER — Ambulatory Visit (HOSPITAL_COMMUNITY)
Admission: EM | Admit: 2023-01-04 | Discharge: 2023-01-04 | Disposition: A | Discharge status: Home / Self Care | Payer: Medicare Other | Attending: Internal Medicine | Admitting: Internal Medicine

## 2023-01-04 DIAGNOSIS — H00011 Hordeolum externum right upper eyelid: Secondary | ICD-10-CM | POA: Diagnosis not present

## 2023-01-04 DIAGNOSIS — H5789 Other specified disorders of eye and adnexa: Secondary | ICD-10-CM | POA: Diagnosis not present

## 2023-01-04 MED ORDER — ERYTHROMYCIN 5 MG/GM OP OINT: TOPICAL_OINTMENT | OPHTHALMIC | 0 refills | Status: AC

## 2023-01-04 NOTE — ED Triage Notes (Signed)
Pt woke up yesterday with right eye lid swelling. Put ice on area to help get swelling down. Today same thing and some drainage.

## 2023-01-04 NOTE — ED Provider Notes (Signed)
[facility]    CSN: [id number] Arrival date & time: 01/04/23  1304      History   Chief Complaint Chief Complaint  Patient presents with   Facial Swelling    HPI Paula Moore is a 77 y.o. female.   Patient presents to urgent care for evaluation of right eye irritation, swelling to the upper right eyelid, and purulent drainage that started yesterday.  She states she has had a stye on the left eye in the past that is currently healing.  She applied cool compress to the right eye yesterday morning and states this helped significantly with symptoms but symptoms returned this morning.  States right eye was crusty upon waking.  No vision changes, recent trauma/injury to the base of the eyes, ear pain, viral URI symptoms, dizziness, sore throat, rhinorrhea, headaches, or N/V.  She does not wear glasses or contacts for vision correction.  Denies recent antibiotic and steroid use.  History of diabetes and hypertension that are well-controlled.     Past Medical History:  Diagnosis Date   Diabetes mellitus without complication (HCC)    prediabetic   High cholesterol    Hypertension    Shingles     Patient Active Problem List   Diagnosis Date Noted   Acute CVA (cerebrovascular accident) (HCC) 11/23/2022   Chest pain 12/12/2014   Hypertensive urgency 12/12/2014   Hypokalemia 12/12/2014   High cholesterol    Diabetes mellitus without complication (HCC)    Hypertension    Left-sided thoracic back pain     Past Surgical History:  Procedure Laterality Date   GUM SURGERY  1978    OB History   No obstetric history on file.      Home Medications    Prior to Admission medications   Medication Sig Start Date End Date Taking? Authorizing Provider  erythromycin ophthalmic ointment Place a 1/2 inch ribbon of ointment into the lower eyelid of the right eye twice daily for 7 days. 01/04/23  Yes Carlisle Beers, FNP  alendronate (FOSAMAX) 70 MG tablet Take 70 mg by  mouth once a week. Thursday    [provider]  amLODipine (NORVASC) 10 MG tablet Take 1 tablet (10 mg total) by mouth daily. 12/13/14   Rhetta Mura, MD  aspirin 81 MG chewable tablet Chew 1 tablet (81 mg total) by mouth daily. 11/24/22   Ghimire, Werner Lean, MD  clopidogrel (PLAVIX) 75 MG tablet Take 1 tablet (75 mg total) by mouth daily. Take along with aspirin for 3 weeks, after 3 weeks stop Plavix and continue on aspirin. 11/25/22   Ghimire, Werner Lean, MD  latanoprost (XALATAN) 0.005 % ophthalmic solution Place 1 drop into both eyes at bedtime.    [provider]  Multiple Vitamins-Minerals (ALIVE ONCE DAILY WOMENS 50+) TABS Take 1 tablet by mouth daily.    [provider]  OVER THE COUNTER MEDICATION Take 1 tablet by mouth daily. Vein Circulation supplement    [provider]  rosuvastatin (CRESTOR) 20 MG tablet Take 1 tablet (20 mg total) by mouth daily. 11/25/22   Ghimire, Werner Lean, MD  UNABLE TO FIND daily at 2 PM. Med Name: Morning Ruth liquid multivitamin    [provider]    Family History Family History  Problem Relation Age of Onset   Heart failure Mother    Heart failure Father    Cancer Sister    Rectal cancer Brother        late 75's  Colon polyps Daughter    Colon cancer Neg Hx    Esophageal cancer Neg Hx    Stomach cancer Neg Hx    Stroke Neg Hx     Social History Social History   Tobacco Use   Smoking status: Never   Smokeless tobacco: Never  Vaping Use   Vaping Use: Never used  Substance Use Topics   Alcohol use: No   Drug use: No     Allergies   Cozaar [losartan potassium] and Lisinopril   Review of Systems Review of Systems Per HPI  Physical Exam Triage Vital Signs ED Triage Vitals  Enc Vitals Group     BP 01/04/23 1312 135/79     Pulse Rate 01/04/23 1312 66     Resp 01/04/23 1312 17     Temp 01/04/23 1312 98.3 F (36.8 C)     Temp Source 01/04/23 1312 Oral     SpO2 01/04/23 1312 98 %      Weight --      Height --      Head Circumference --      Peak Flow --      Pain Score 01/04/23 1311 2     Pain Loc --      Pain Edu? --      Excl. in GC? --    No data found.  Updated Vital Signs BP 135/79 (BP Location: Right Arm)   Pulse 66   Temp 98.3 F (36.8 C) (Oral)   Resp 17   SpO2 98%   Visual Acuity Right Eye Distance:   Left Eye Distance:   Bilateral Distance:    Right Eye Near:   Left Eye Near:    Bilateral Near:     Physical Exam Vitals and nursing note reviewed.  Constitutional:      Appearance: She is not ill-appearing or toxic-appearing.  HENT:     Head: Normocephalic and atraumatic.     Right Ear: Hearing, tympanic membrane, ear canal and external ear normal.     Left Ear: Hearing, tympanic membrane, ear canal and external ear normal.     Nose: Nose normal.     Mouth/Throat:     Lips: Pink.  Eyes:     General: Lids are normal. Vision grossly intact. Gaze aligned appropriately.        Right eye: Hordeolum (upper right eyelid) present. No foreign body or discharge.     Extraocular Movements: Extraocular movements intact.     Conjunctiva/sclera: Conjunctivae normal.     Right eye: Right conjunctiva is not injected. No chemosis, exudate or hemorrhage.    Left eye: Left conjunctiva is not injected. No chemosis, exudate or hemorrhage. Pulmonary:     Effort: Pulmonary effort is normal.  Musculoskeletal:     Cervical back: Neck supple.  Lymphadenopathy:     Cervical: No cervical adenopathy.  Skin:    General: Skin is warm and dry.     Capillary Refill: Capillary refill takes less than 2 seconds.     Findings: No rash.  Neurological:     General: No focal deficit present.     Mental Status: She is alert and oriented to person, place, and time. Mental status is at baseline.     Cranial Nerves: No dysarthria or facial asymmetry.  Psychiatric:        Mood and Affect: Mood normal.        Speech: Speech normal.        Behavior: Behavior normal.  Thought Content: Thought content normal.        Judgment: Judgment normal.      UC Treatments / Results  Labs (all labs ordered are listed, but only abnormal results are displayed) Labs Reviewed - No data to display  EKG   Radiology No results found.  Procedures Procedures (including critical care time)  Medications Ordered in UC Medications - No data to display  Initial Impression / Assessment and Plan / UC Course  I have reviewed the triage vital signs and the nursing notes.  Pertinent labs & imaging results that were available during my care of the patient were reviewed by me and considered in my medical decision making (see chart for details).   1.  Hordeolum externum of right upper eyelid, irritation of right eye HEENT exam is stable.  Will manage this with erythromycin eye ointment to the right eye twice daily for the next 7 days to treat irritation and possible infection to the right eye given mucopurulent drainage.  Warm compresses recommended.  Advise follow-up with ophthalmologist if symptoms fail to improve in the next 3 to 4 days with use of antibiotic eye ointment.  Low suspicion for corneal abrasion due to lack of trauma, therefore deferred fluorescein stain.  Symptoms are overall improving.  Discussed physical exam and available lab work findings in clinic with patient.  Counseled patient regarding appropriate use of medications and potential side effects for all medications recommended or prescribed today. Discussed red flag signs and symptoms of worsening condition,when to call the PCP office, return to urgent care, and when to seek higher level of care in the emergency department. Patient verbalizes understanding and agreement with plan. All questions answered. Patient discharged in stable condition.    Final Clinical Impressions(s) / UC Diagnoses   Final diagnoses:  Hordeolum externum of right upper eyelid  Irritation of right eye     Discharge  Instructions      You have a stye to the right eye.  Perform warm compresses before applying eye ointment. Apply erythromycin eye ointment twice daily for the next 7 days to treat infection to the eye. Follow-up with eye doctor.   If you develop any new or worsening symptoms or do not improve in the next 2 to 3 days, please return.  If your symptoms are severe, please go to the emergency room.  Follow-up with your primary care provider for further evaluation and management of your symptoms as well as ongoing wellness visits.  I hope you feel better!   ED Prescriptions     Medication Sig Dispense Auth. Provider   erythromycin ophthalmic ointment Place a 1/2 inch ribbon of ointment into the lower eyelid of the right eye twice daily for 7 days. 3.5 g Carlisle Beers, FNP      PDMP not reviewed this encounter.   Carlisle Beers, [place] 01/04/23 1334

## 2023-01-04 NOTE — Discharge Instructions (Signed)
You have a stye to the right eye.  Perform warm compresses before applying eye ointment. Apply erythromycin eye ointment twice daily for the next 7 days to treat infection to the eye. Follow-up with eye doctor.   If you develop any new or worsening symptoms or do not improve in the next 2 to 3 days, please return.  If your symptoms are severe, please go to the emergency room.  Follow-up with your primary care provider for further evaluation and management of your symptoms as well as ongoing wellness visits.  I hope you feel better!

## 2023-01-07 ENCOUNTER — Telehealth: Payer: Self-pay | Admitting: *Deleted

## 2023-01-07 NOTE — Telephone Encounter (Signed)
Called pt & LVM (Ok per [facility], [place]) advising 30 day heart monitor was unremarkable. Per Aundra Millet NP, can consider loop recorder. I asked the patient to give [place] a call back to discuss if ok to send to cardiology. Left office number and hours in message.

## 2023-01-07 NOTE — Telephone Encounter (Signed)
-----   Message from Butch Penny, NP sent at 01/03/2023  9:14 AM EDT ----- Please call patient and let her know that since her 30-day cardiac monitor was unremarkable we could consider a loop recorder.  If she is amenable I can place an order to cardiology ----- Message ----- From: Kennon Rounds Sent: 01/02/2023   5:34 PM EDT To: Butch Penny, NP

## 2023-01-08 NOTE — Telephone Encounter (Signed)
LMVM for pt to return call for results.  ?

## 2023-01-08 NOTE — Telephone Encounter (Signed)
Spoke to patient gave results pt didn't want to proceed with loop recorder . Pt thanked me for calling  Will make Magan,NP aware

## 2023-01-09 NOTE — Telephone Encounter (Signed)
noted
